# Patient Record
Sex: Female | Born: 1955 | Hispanic: No | Marital: Married | Smoking: Never smoker
Health system: Southern US, Community
[De-identification: ages and names within clinical notes are randomized; demographics above are authoritative.]

## PROBLEM LIST (undated history)

## (undated) DIAGNOSIS — R7303 Prediabetes: Secondary | ICD-10-CM

## (undated) DIAGNOSIS — M1990 Unspecified osteoarthritis, unspecified site: Secondary | ICD-10-CM

## (undated) DIAGNOSIS — I10 Essential (primary) hypertension: Secondary | ICD-10-CM

## (undated) HISTORY — PX: HIP ARTHROPLASTY: SHX981

## (undated) HISTORY — DX: Essential (primary) hypertension: I10

---

## 1999-07-10 ENCOUNTER — Other Ambulatory Visit: Admission: RE | Admit: 1999-07-10 | Discharge: 1999-07-10 | Payer: Self-pay | Admitting: Family Medicine

## 2000-08-28 ENCOUNTER — Other Ambulatory Visit: Admission: RE | Admit: 2000-08-28 | Discharge: 2000-08-28 | Payer: Self-pay | Admitting: Family Medicine

## 2000-09-05 ENCOUNTER — Encounter: Admission: RE | Admit: 2000-09-05 | Discharge: 2000-09-05 | Payer: Self-pay | Admitting: Family Medicine

## 2000-09-05 ENCOUNTER — Encounter: Payer: Self-pay | Admitting: Family Medicine

## 2003-10-22 ENCOUNTER — Other Ambulatory Visit: Admission: RE | Admit: 2003-10-22 | Discharge: 2003-10-22 | Payer: Self-pay | Admitting: Family Medicine

## 2004-10-26 ENCOUNTER — Other Ambulatory Visit: Admission: RE | Admit: 2004-10-26 | Discharge: 2004-10-26 | Payer: Self-pay | Admitting: Family Medicine

## 2005-10-29 ENCOUNTER — Other Ambulatory Visit: Admission: RE | Admit: 2005-10-29 | Discharge: 2005-10-29 | Payer: Self-pay | Admitting: Family Medicine

## 2006-01-20 ENCOUNTER — Observation Stay (HOSPITAL_COMMUNITY): Admission: EM | Admit: 2006-01-20 | Discharge: 2006-01-21 | Payer: Self-pay | Admitting: Emergency Medicine

## 2006-12-05 ENCOUNTER — Other Ambulatory Visit: Admission: RE | Admit: 2006-12-05 | Discharge: 2006-12-05 | Payer: Self-pay | Admitting: Family Medicine
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
## 2008-03-26 ENCOUNTER — Other Ambulatory Visit: Admission: RE | Admit: 2008-03-26 | Discharge: 2008-03-26 | Payer: Self-pay | Admitting: Family Medicine

## 2009-05-23 ENCOUNTER — Other Ambulatory Visit: Admission: RE | Admit: 2009-05-23 | Discharge: 2009-05-23 | Payer: Self-pay | Admitting: Family Medicine

## 2010-05-24 ENCOUNTER — Other Ambulatory Visit
Admission: RE | Admit: 2010-05-24 | Discharge: 2010-05-24 | Payer: Self-pay | Source: Home / Self Care | Admitting: Family Medicine
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
## 2011-05-30 ENCOUNTER — Other Ambulatory Visit (HOSPITAL_COMMUNITY)
Admission: RE | Admit: 2011-05-30 | Discharge: 2011-05-30 | Disposition: A | Discharge status: Home / Self Care | Payer: 59 | Source: Ambulatory Visit | Attending: Family Medicine | Admitting: Family Medicine

## 2011-05-30 ENCOUNTER — Other Ambulatory Visit: Payer: Self-pay | Admitting: Family Medicine

## 2011-05-30 DIAGNOSIS — Z01419 Encounter for gynecological examination (general) (routine) without abnormal findings: Secondary | ICD-10-CM | POA: Insufficient documentation

## 2011-09-05 ENCOUNTER — Telehealth: Payer: Self-pay

## 2011-09-05 NOTE — Telephone Encounter (Signed)
Laurie Cooke and stated she was calling back about her father Laurie Cooke. See phone message under his name

## 2011-09-05 NOTE — Telephone Encounter (Signed)
Pt had called and left message on my VM stating that she was returning our call. I do not see a record of anyone trying to reach pt. We may have been trying to reach her about another pt? LMOM on pt's [phone] that she had left on VM to CB.

## 2014-05-08 ENCOUNTER — Ambulatory Visit (INDEPENDENT_AMBULATORY_CARE_PROVIDER_SITE_OTHER): Payer: 59 | Admitting: Family Medicine

## 2014-05-08 VITALS — BP 132/80 | HR 95 | Temp 98.7°F | Resp 18 | Ht 63.75 in | Wt 194.2 lb

## 2014-05-08 DIAGNOSIS — R05 Cough: Secondary | ICD-10-CM

## 2014-05-08 DIAGNOSIS — J069 Acute upper respiratory infection, unspecified: Secondary | ICD-10-CM

## 2014-05-08 DIAGNOSIS — R059 Cough, unspecified: Secondary | ICD-10-CM

## 2014-05-08 MED ORDER — AMOXICILLIN 875 MG PO TABS: 875.0000 mg | ORAL_TABLET | Freq: Two times a day (BID) | ORAL | Status: DC

## 2014-05-08 MED ORDER — BENZONATATE 100 MG PO CAPS: 100.0000 mg | ORAL_CAPSULE | Freq: Three times a day (TID) | ORAL | Status: DC | PRN

## 2014-05-08 MED ORDER — HYDROCODONE-HOMATROPINE 5-1.5 MG/5ML PO SYRP: 5.0000 mL | ORAL_SOLUTION | ORAL | Status: DC | PRN

## 2014-05-08 NOTE — Patient Instructions (Signed)
Drink plenty of fluids  Get enough rest  Take the antibiotic, amoxicillin, one pill twice daily  Take the benzonatate cough pills one or 2 pills 3 times daily as needed for cough. These are good to use during waking hours.  Take the cough syrup 1 teaspoon every 4 hours as needed for severe cough. This tends to be sedating and is best used at night or when you're not going to be going to work.  Return if worse

## 2014-05-08 NOTE — Progress Notes (Signed)
Subjective: 58 year old lady who's been having a respiratory tract infection for the past 10 days. It started as a sore throat. Then it has settled down into her chest. She has been coughing a lot especially last couple of days. She brings up phlegm. She does not smoke. 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 She is generally healthy. She takes blood pressure medicine one daily, goes to her regular primary care doctor Dr. Wynelle LinkSun on about an annual basis.  Objective: TMs normal. Throat clear. Neck supple without significant nodes. Chest is clear to auscultation without any wheezing. Heart regular without murmurs. She does have a pretty constant cough.  Assessment: Cough URI  Plan: This been going on for 10 days. Will go ahead and treat with antibiotics hence symptomatic cough suppressant.

## 2015-01-17 ENCOUNTER — Other Ambulatory Visit: Payer: Self-pay | Admitting: Family Medicine

## 2015-01-17 ENCOUNTER — Other Ambulatory Visit (HOSPITAL_COMMUNITY)
Admission: RE | Admit: 2015-01-17 | Discharge: 2015-01-17 | Disposition: A | Discharge status: Home / Self Care | Payer: 59 | Source: Ambulatory Visit | Attending: Family Medicine | Admitting: Family Medicine

## 2015-01-17 DIAGNOSIS — Z01419 Encounter for gynecological examination (general) (routine) without abnormal findings: Secondary | ICD-10-CM | POA: Insufficient documentation

## 2015-01-17 DIAGNOSIS — Z1151 Encounter for screening for human papillomavirus (HPV): Secondary | ICD-10-CM | POA: Insufficient documentation

## 2015-01-18 LAB — CYTOLOGY - PAP

## 2016-08-31 DIAGNOSIS — I10 Essential (primary) hypertension: Secondary | ICD-10-CM | POA: Diagnosis not present

## 2016-11-22 DIAGNOSIS — H401121 Primary open-angle glaucoma, left eye, mild stage: Secondary | ICD-10-CM | POA: Diagnosis not present

## 2017-03-20 DIAGNOSIS — R7303 Prediabetes: Secondary | ICD-10-CM | POA: Diagnosis not present

## 2017-03-20 DIAGNOSIS — I10 Essential (primary) hypertension: Secondary | ICD-10-CM | POA: Diagnosis not present

## 2017-04-04 DIAGNOSIS — M25561 Pain in right knee: Secondary | ICD-10-CM | POA: Diagnosis not present

## 2017-04-04 DIAGNOSIS — M25562 Pain in left knee: Secondary | ICD-10-CM | POA: Diagnosis not present

## 2017-04-04 DIAGNOSIS — M1711 Unilateral primary osteoarthritis, right knee: Secondary | ICD-10-CM | POA: Diagnosis not present

## 2017-04-04 DIAGNOSIS — M1712 Unilateral primary osteoarthritis, left knee: Secondary | ICD-10-CM | POA: Diagnosis not present

## 2017-06-10 DIAGNOSIS — H401112 Primary open-angle glaucoma, right eye, moderate stage: Secondary | ICD-10-CM | POA: Diagnosis not present

## 2017-09-25 DIAGNOSIS — I10 Essential (primary) hypertension: Secondary | ICD-10-CM | POA: Diagnosis not present

## 2017-10-05 DIAGNOSIS — Z1231 Encounter for screening mammogram for malignant neoplasm of breast: Secondary | ICD-10-CM | POA: Diagnosis not present

## 2017-12-30 DIAGNOSIS — H401112 Primary open-angle glaucoma, right eye, moderate stage: Secondary | ICD-10-CM | POA: Diagnosis not present

## 2017-12-30 DIAGNOSIS — H25811 Combined forms of age-related cataract, right eye: Secondary | ICD-10-CM | POA: Diagnosis not present

## 2018-04-02 DIAGNOSIS — Z01818 Encounter for other preprocedural examination: Secondary | ICD-10-CM | POA: Diagnosis not present

## 2018-04-02 DIAGNOSIS — H409 Unspecified glaucoma: Secondary | ICD-10-CM | POA: Diagnosis not present

## 2018-04-02 DIAGNOSIS — H401112 Primary open-angle glaucoma, right eye, moderate stage: Secondary | ICD-10-CM | POA: Diagnosis not present

## 2018-04-02 DIAGNOSIS — H401121 Primary open-angle glaucoma, left eye, mild stage: Secondary | ICD-10-CM | POA: Diagnosis not present

## 2018-04-02 DIAGNOSIS — H25811 Combined forms of age-related cataract, right eye: Secondary | ICD-10-CM | POA: Diagnosis not present

## 2018-04-16 DIAGNOSIS — H409 Unspecified glaucoma: Secondary | ICD-10-CM | POA: Diagnosis not present

## 2018-04-16 DIAGNOSIS — H401121 Primary open-angle glaucoma, left eye, mild stage: Secondary | ICD-10-CM | POA: Diagnosis not present

## 2018-04-16 DIAGNOSIS — H2512 Age-related nuclear cataract, left eye: Secondary | ICD-10-CM | POA: Diagnosis not present

## 2018-12-25 ENCOUNTER — Other Ambulatory Visit: Payer: Self-pay | Admitting: Family Medicine

## 2018-12-25 DIAGNOSIS — M25552 Pain in left hip: Secondary | ICD-10-CM

## 2019-01-04 ENCOUNTER — Ambulatory Visit
Admission: RE | Admit: 2019-01-04 | Discharge: 2019-01-04 | Disposition: A | Discharge status: Home / Self Care | Payer: 59 | Source: Ambulatory Visit | Attending: Family Medicine | Admitting: Family Medicine

## 2019-01-04 ENCOUNTER — Other Ambulatory Visit: Payer: Self-pay

## 2019-01-04 DIAGNOSIS — M25552 Pain in left hip: Secondary | ICD-10-CM

## 2019-05-02 ENCOUNTER — Other Ambulatory Visit: Payer: Self-pay

## 2019-05-02 ENCOUNTER — Ambulatory Visit
Admission: EM | Admit: 2019-05-02 | Discharge: 2019-05-02 | Disposition: A | Discharge status: Home / Self Care | Payer: Managed Care, Other (non HMO) | Attending: Emergency Medicine | Admitting: Emergency Medicine

## 2019-05-02 ENCOUNTER — Encounter: Payer: Self-pay | Admitting: Emergency Medicine

## 2019-05-02 DIAGNOSIS — R220 Localized swelling, mass and lump, head: Secondary | ICD-10-CM

## 2019-05-02 MED ORDER — CEPHALEXIN 500 MG PO CAPS: 500.0000 mg | ORAL_CAPSULE | Freq: Two times a day (BID) | ORAL | 0 refills | Status: AC

## 2019-05-02 NOTE — ED Triage Notes (Signed)
Pt presents to [facility] after waking up yesterday with right sided facial swelling.  Denies dental pain.  States it is sensitive to touch and feels hard.

## 2019-05-02 NOTE — Discharge Instructions (Addendum)
Take antibiotic as prescribed. May use ice for swelling. Important to call your PCP, dentist on Monday to schedule follow-up appointments: Preferably 1 to 2 weeks from today.

## 2019-05-02 NOTE — ED Notes (Signed)
Patient able to ambulate independently  

## 2019-05-02 NOTE — ED Provider Notes (Signed)
[facility]    CSN: [id number] Arrival date & time: 05/02/19  0843      History   Chief Complaint Chief Complaint  Patient presents with  . Facial Swelling    HPI Laurie Cooke is a 63 y.o. female with history of hypertension presenting for right-sided facial swelling since yesterday morning.  Patient denies dental pain: Followed by dentist routinely-last visit was in September without significant dental work done at that time.  Patient denies history of boils, lipomas, trauma to the area.  Denies teeth grinding.  Patient has not done anything for her symptoms.   Past Medical History:  Diagnosis Date  . Hypertension     There are no active problems to display for this patient.   History reviewed. No pertinent surgical history.  OB History   No obstetric history on file.      Home Medications    Prior to Admission medications   Medication Sig Start Date End Date Taking? Authorizing Provider  cephALEXin (KEFLEX) 500 MG capsule Take 1 capsule (500 mg total) by mouth 2 (two) times daily for 5 days. 05/02/19 05/07/19  Hall-Potvin, [place], PA-C  valsartan-hydrochlorothiazide (DIOVAN-HCT) 160-25 MG per tablet Take 1 tablet by mouth daily.    [provider]    Family History Family History  Problem Relation Age of Onset  . Hypertension Mother   . Hypertension Father   . Diabetes Father     Social History Social History   Tobacco Use  . Smoking status: Never Smoker  . Smokeless tobacco: Never Used  Substance Use Topics  . Alcohol use: No    Alcohol/week: 0.0 standard drinks  . Drug use: No     Allergies   Patient has no known allergies.   Review of Systems Review of Systems  Constitutional: Negative for activity change, appetite change, fatigue and fever.  HENT: Negative for dental problem, drooling, ear pain, sinus pain, sore throat, trouble swallowing and voice change.   Eyes: Negative for pain, redness and visual disturbance.   Respiratory: Negative for cough and shortness of breath.   Cardiovascular: Negative for chest pain and palpitations.  Gastrointestinal: Negative for abdominal pain, diarrhea and vomiting.  Musculoskeletal: Negative for arthralgias and myalgias.  Skin: Negative for rash and wound.       Right cheek swelling  Neurological: Negative for syncope and headaches.     Physical Exam Triage Vital Signs ED Triage Vitals  Enc Vitals Group     BP 05/02/19 0857 (!) 150/87     Pulse Rate 05/02/19 0857 72     Resp 05/02/19 0857 18     Temp 05/02/19 0857 97.9 F (36.6 C)     Temp Source 05/02/19 0857 Temporal     SpO2 05/02/19 0857 96 %     Weight --      Height --      Head Circumference --      Peak Flow --      Pain Score 05/02/19 0858 0     Pain Loc --      Pain Edu? --      Excl. in [place]? --    No data found.  Updated Vital Signs BP (!) 150/87 (BP Location: Left Arm)   Pulse 72   Temp 97.9 F (36.6 C) (Temporal)   Resp 18   SpO2 96%   Visual Acuity Right Eye Distance:   Left Eye Distance:   Bilateral Distance:    Right Eye Near:  Left Eye Near:    Bilateral Near:     Physical Exam Constitutional:      General: She is not in acute distress. HENT:     Head: Normocephalic and atraumatic.     Mouth/Throat:     Mouth: Mucous membranes are moist.     Pharynx: Oropharynx is clear.     Comments: Fair dentition.  Right upper quadrant without gingival edema, injection, discharge, open wound.  No open wound to inside of affected cheek. Eyes:     General: No scleral icterus.    Pupils: Pupils are equal, round, and reactive to light.  Cardiovascular:     Rate and Rhythm: Normal rate and regular rhythm.  Pulmonary:     Effort: Pulmonary effort is normal. No respiratory distress.     Breath sounds: No wheezing.  Skin:    Capillary Refill: Capillary refill takes less than 2 seconds.     Coloration: Skin is not jaundiced or pale.     Findings: No erythema or rash.      Comments: 2 cm area of firm, nodularity.  No fluctuance, warmth, tenderness to palpation.  Neurological:     Mental Status: She is alert and oriented to person, place, and time.      UC Treatments / Results  Labs (all labs ordered are listed, but only abnormal results are displayed) Labs Reviewed - No data to display  EKG   Radiology No results found.  Procedures Procedures (including critical care time)  Medications Ordered in UC Medications - No data to display  Initial Impression / Assessment and Plan / UC Course  I have reviewed the triage vital signs and the nursing notes.  Pertinent labs & imaging results that were available during my care of the patient were reviewed by me and considered in my medical decision making (see chart for details).     Discussed lipoma versus forming abscess.  Patient denies history of abscess, exam today more consistent with lipoma.  Will treat swelling with ice, possible underlying infection with antibiotic as outlined below.  Discussed appropriate follow-up with dentist, PCP, specifically if no improvement with antibiotics as this would support lipoma/fatty growth.  Return precautions discussed, patient verbalized understanding and is agreeable to plan. Final Clinical Impressions(s) / UC Diagnoses   Final diagnoses:  Right facial swelling     Discharge Instructions     Take antibiotic as prescribed. May use ice for swelling. Important to call your PCP, dentist on Monday to schedule follow-up appointments: Preferably 1 to 2 weeks from today.    ED Prescriptions    Medication Sig Dispense Auth. Provider   cephALEXin (KEFLEX) 500 MG capsule Take 1 capsule (500 mg total) by mouth 2 (two) times daily for 5 days. 10 capsule Hall-Potvin, [place], PA-C     PDMP not reviewed this encounter.   Odette Fraction [place], [place] 05/02/19 [phone]

## 2020-04-29 ENCOUNTER — Ambulatory Visit: Payer: Self-pay

## 2020-04-29 ENCOUNTER — Ambulatory Visit (INDEPENDENT_AMBULATORY_CARE_PROVIDER_SITE_OTHER): Payer: Managed Care, Other (non HMO)

## 2020-04-29 ENCOUNTER — Ambulatory Visit: Payer: Managed Care, Other (non HMO) | Admitting: Orthopaedic Surgery

## 2020-04-29 ENCOUNTER — Other Ambulatory Visit: Payer: Self-pay

## 2020-04-29 ENCOUNTER — Encounter: Payer: Self-pay | Admitting: Orthopaedic Surgery

## 2020-04-29 VITALS — Ht 63.0 in | Wt 197.0 lb

## 2020-04-29 DIAGNOSIS — G8929 Other chronic pain: Secondary | ICD-10-CM | POA: Diagnosis not present

## 2020-04-29 DIAGNOSIS — M1712 Unilateral primary osteoarthritis, left knee: Secondary | ICD-10-CM | POA: Insufficient documentation

## 2020-04-29 DIAGNOSIS — M25561 Pain in right knee: Secondary | ICD-10-CM | POA: Diagnosis not present

## 2020-04-29 DIAGNOSIS — M1711 Unilateral primary osteoarthritis, right knee: Secondary | ICD-10-CM | POA: Insufficient documentation

## 2020-04-29 DIAGNOSIS — M25562 Pain in left knee: Secondary | ICD-10-CM | POA: Diagnosis not present

## 2020-04-29 MED ORDER — MELOXICAM 7.5 MG PO TABS: 7.5000 mg | ORAL_TABLET | Freq: Two times a day (BID) | ORAL | 2 refills | Status: DC | PRN

## 2020-04-29 NOTE — Progress Notes (Signed)
Office Visit Note   Patient: Laurie Cooke           Date of Birth: 09-May-1956           MRN: [id number] Visit Date: 04/29/2020              Requested by: Deatra James, MD [phone] [address],  [place] [address] PCP: Deatra James, MD   Assessment & Plan: Visit Diagnoses:  1. Primary osteoarthritis of right knee   2. Primary osteoarthritis of left knee     Plan: Impression is end-stage bilateral knee DJD worse on the left.  We had a lengthy discussion on treatment options which include both continued nonsurgical and surgical treatment.  She is hesitant to undergo a knee replacement but she understands this is probably most likely way that she will obtain meaningful and long-lasting pain relief and quality of life.  We have given her information on knee replacement surgery.  Questions encouraged and answered.  Meloxicam refilled today.  Follow-up as needed.  Follow-Up Instructions: Return if symptoms worsen or fail to improve.   Orders:  Orders Placed This Encounter  Procedures  . XR KNEE 3 VIEW LEFT  . XR KNEE 3 VIEW RIGHT   Meds ordered this encounter  Medications  . meloxicam (MOBIC) 7.5 MG tablet    Sig: Take 1 tablet (7.5 mg total) by mouth 2 (two) times daily as needed for pain.    Dispense:  60 tablet    Refill:  2      Procedures: No procedures performed   Clinical Data: No additional findings.   Subjective: Chief Complaint  Patient presents with  . Right Knee - Pain  . Left Knee - Pain    [name] is a 64 year old female who comes in for bilateral knee pain for over 9 years with recent worsening worse in the left knee.  She has had prior cortisone injections which only helped the right knee.  The left knee hurts constantly and all over and especially at night.  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  She has a lot of trouble with steps.  She has no quality of life.  She is currently taking Tylenol and meloxicam which  only help take the edge off.  Denies any numbness and tingling.   Review of Systems  Constitutional: Negative.   HENT: Negative.   Eyes: Negative.   Respiratory: Negative.   Cardiovascular: Negative.   Endocrine: Negative.   Musculoskeletal: Negative.   Neurological: Negative.   Hematological: Negative.   Psychiatric/Behavioral: Negative.   All other systems reviewed and are negative.    Objective: Vital Signs: Ht 5\' 3"  (1.6 m)   Wt 197 lb (89.4 kg)   BMI 34.90 kg/m   Physical Exam Vitals and nursing note reviewed.  Constitutional:      Appearance: She is well-developed.  HENT:     Head: Normocephalic and atraumatic.  Pulmonary:     Effort: Pulmonary effort is normal.  Abdominal:     Palpations: Abdomen is soft.  Musculoskeletal:     Cervical back: Neck supple.  Skin:    General: Skin is warm.     Capillary Refill: Capillary refill takes less than 2 seconds.  Neurological:     Mental Status: She is alert and oriented to person, place, and time.  Psychiatric:        Behavior: Behavior normal.  Thought Content: Thought content normal.        Judgment: Judgment normal.     Ortho Exam Bilateral knees show varus deformities.  No joint effusion.  2+ patellofemoral crepitus with range of motion with moderate limitation.  Collaterals and cruciates are stable. Specialty Comments:  No specialty comments available.  Imaging: XR KNEE 3 VIEW LEFT  Result Date: 04/29/2020 Severe tricompartmental DJD with lateral tibial subluxation and varus deformity  XR KNEE 3 VIEW RIGHT  Result Date: 04/29/2020 Severe tricompartmental DJD with varus deformity and early lateral tibial subluxation    PMFS History: Patient Active Problem List   Diagnosis Date Noted  . Primary osteoarthritis of left knee 04/29/2020  . Primary osteoarthritis of right knee 04/29/2020   Past Medical History:  Diagnosis Date  . Hypertension     Family History  Problem Relation Age of  Onset  . Hypertension Mother   . Hypertension Father   . Diabetes Father     History reviewed. No pertinent surgical history. Social History   Occupational History  . Not on file  Tobacco Use  . Smoking status: Never Smoker  . Smokeless tobacco: Never Used  Substance and Sexual Activity  . Alcohol use: No    Alcohol/week: 0.0 standard drinks  . Drug use: No  . Sexual activity: Not on file

## 2020-07-25 ENCOUNTER — Other Ambulatory Visit: Payer: Self-pay | Admitting: Orthopaedic Surgery

## 2021-01-13 ENCOUNTER — Other Ambulatory Visit (HOSPITAL_COMMUNITY)
Admission: RE | Admit: 2021-01-13 | Discharge: 2021-01-13 | Disposition: A | Discharge status: Home / Self Care | Payer: Managed Care, Other (non HMO) | Source: Ambulatory Visit | Attending: Family Medicine | Admitting: Family Medicine

## 2021-01-13 ENCOUNTER — Other Ambulatory Visit: Payer: Self-pay | Admitting: Family Medicine

## 2021-01-13 DIAGNOSIS — Z124 Encounter for screening for malignant neoplasm of cervix: Secondary | ICD-10-CM | POA: Insufficient documentation

## 2021-01-17 LAB — CYTOLOGY - PAP
Comment: NEGATIVE
Diagnosis: NEGATIVE
High risk HPV: NEGATIVE

## 2021-01-22 ENCOUNTER — Other Ambulatory Visit: Payer: Self-pay | Admitting: Physician Assistant

## 2021-11-16 ENCOUNTER — Other Ambulatory Visit (HOSPITAL_COMMUNITY): Payer: Managed Care, Other (non HMO)

## 2021-11-29 ENCOUNTER — Ambulatory Visit: Admit: 2021-11-29 | Payer: Managed Care, Other (non HMO) | Admitting: Orthopedic Surgery

## 2021-11-29 SURGERY — ARTHROPLASTY, HIP, TOTAL, ANTERIOR APPROACH
Anesthesia: Choice | Site: Hip | Laterality: Left

## 2021-12-15 ENCOUNTER — Ambulatory Visit: Payer: Self-pay | Admitting: Student

## 2022-02-05 NOTE — Patient Instructions (Addendum)
SURGICAL WAITING ROOM VISITATION Patients having surgery or a procedure may have no more than 2 support people in the waiting area - these visitors may rotate.   Children under the age of 18 must have an adult with them who is not the patient. If the patient needs to stay at the hospital during part of their recovery, the visitor guidelines for inpatient rooms apply. Pre-op nurse will coordinate an appropriate time for 1 support person to accompany patient in pre-op.  This support person may not rotate.    Please refer to the [facility] website for the visitor guidelines for Inpatients (after your surgery is over and you are in a regular room).      Your procedure is scheduled on: 02-22-22   Report to [facility] Main Entrance    Report to admitting at 5:15 AM   Call this number if you have problems the morning of surgery [phone]   Do not eat food :After Midnight.   After Midnight you may have the following liquids until 4:30 AM DAY OF SURGERY  Water Non-Citrus Juices (without pulp, NO RED) Carbonated Beverages Black Coffee (NO MILK/CREAM OR CREAMERS, sugar ok)  Clear Tea (NO MILK/CREAM OR CREAMERS, sugar ok) regular and decaf                             Plain Jell-O (NO RED)                                           Fruit ices (not with fruit pulp, NO RED)                                     Popsicles (NO RED)                                                               Sports drinks like Gatorade (NO RED)                  The day of surgery:  Drink ONE (1) Pre-Surgery G2 at 4:30 AM the morning of surgery. Drink in one sitting. Do not sip.  This drink was given to you during your hospital  pre-op appointment visit. Nothing else to drink after completing the Pre-Surgery G2          If you have questions, please contact your surgeon's office.   FOLLOW  AND ANY ADDITIONAL PRE OP INSTRUCTIONS YOU RECEIVED FROM YOUR SURGEON'S OFFICE!!!     Oral Hygiene is also  important to reduce your risk of infection.                                    Remember - BRUSH YOUR TEETH THE MORNING OF SURGERY WITH YOUR REGULAR TOOTHPASTE   Do NOT smoke after Midnight   Take these medicines the morning of surgery with A SIP OF WATER: None  You may not have any metal on your body including hair pins, jewelry, and body piercing             Do not wear make-up, lotions, powders, perfumes or deodorant  Do not wear nail polish including gel and S&S, artificial/acrylic nails, or any other type of covering on natural nails including finger and toenails. If you have artificial nails, gel coating, etc. that needs to be removed by a nail salon please have this removed prior to surgery or surgery may need to be canceled/ delayed if the surgeon/ anesthesia feels like they are unable to be safely monitored.   Do not shave  48 hours prior to surgery.    Do not bring valuables to the hospital. [facility] IS NOT RESPONSIBLE   FOR VALUABLES.   Contacts, dentures or bridgework may not be worn into surgery.  DO NOT BRING YOUR HOME MEDICATIONS TO THE HOSPITAL. PHARMACY WILL DISPENSE MEDICATIONS LISTED ON YOUR MEDICATION LIST TO YOU DURING YOUR ADMISSION IN THE HOSPITAL!   Patients discharged on the day of surgery will not be allowed to drive home.  Someone NEEDS to stay with you for the first 24 hours after anesthesia.   Please read over the following fact sheets you were given: IF YOU HAVE QUESTIONS ABOUT YOUR PRE-OP INSTRUCTIONS PLEASE CALL [phone] [facility] - Preparing for Surgery Before surgery, you can play an important role.  Because skin is not sterile, your skin needs to be as free of germs as possible.  You can reduce the number of germs on your skin by washing with CHG (chlorahexidine gluconate) soap before surgery.  CHG is an antiseptic cleaner which kills germs and bonds with the skin to continue killing germs even after  washing. Please DO NOT use if you have an allergy to CHG or antibacterial soaps.  If your skin becomes reddened/irritated stop using the CHG and inform your nurse when you arrive at Short Stay. Do not shave (including legs and underarms) for at least 48 hours prior to the first CHG shower.  You may shave your face/neck.  Please follow these instructions carefully:  1.  Shower with CHG Soap the night before surgery and the  morning of surgery.  2.  If you choose to wash your hair, wash your hair first as usual with your normal  shampoo.  3.  After you shampoo, rinse your hair and body thoroughly to remove the shampoo.                             4.  Use CHG as you would any other liquid soap.  You can apply chg directly to the skin and wash.  Gently with a scrungie or clean washcloth.  5.  Apply the CHG Soap to your body ONLY FROM THE NECK DOWN.   Do   not use on face/ open                           Wound or open sores. Avoid contact with eyes, ears mouth and   genitals (private parts).                       Wash face,  Genitals (private parts) with your normal soap.             6.  Wash thoroughly, paying special attention to the  area where your    surgery  will be performed.  7.  Thoroughly rinse your body with warm water from the neck down.  8.  DO NOT shower/wash with your normal soap after using and rinsing off the CHG Soap.                9.  Pat yourself dry with a clean towel.            10.  Wear clean pajamas.            11.  Place clean sheets on your bed the night of your first shower and do not  sleep with pets. Day of Surgery : Do not apply any lotions/deodorants the morning of surgery.  Please wear clean clothes to the hospital/surgery center.  FAILURE TO FOLLOW THESE INSTRUCTIONS MAY RESULT IN THE CANCELLATION OF YOUR SURGERY  PATIENT SIGNATURE_________________________________  NURSE  SIGNATURE__________________________________  ________________________________________________________________________     Laurie Cooke  An incentive spirometer is a tool that can help keep your lungs clear and active. This tool measures how well you are filling your lungs with each breath. Taking long deep breaths may help reverse or decrease the chance of developing breathing (pulmonary) problems (especially infection) following: A long period of time when you are unable to move or be active. BEFORE THE PROCEDURE  If the spirometer includes an indicator to show your best effort, your nurse or respiratory therapist will set it to a desired goal. If possible, sit up straight or lean slightly forward. Try not to slouch. Hold the incentive spirometer in an upright position. INSTRUCTIONS FOR USE  Sit on the edge of your bed if possible, or sit up as far as you can in bed or on a chair. Hold the incentive spirometer in an upright position. Breathe out normally. Place the mouthpiece in your mouth and seal your lips tightly around it. Breathe in slowly and as deeply as possible, raising the piston or the ball toward the top of the column. Hold your breath for 3-5 seconds or for as long as possible. Allow the piston or ball to fall to the bottom of the column. Remove the mouthpiece from your mouth and breathe out normally. Rest for a few seconds and repeat Steps 1 through 7 at least 10 times every 1-2 hours when you are awake. Take your time and take a few normal breaths between deep breaths. The spirometer may include an indicator to show your best effort. Use the indicator as a goal to work toward during each repetition. After each set of 10 deep breaths, practice coughing to be sure your lungs are clear. If you have an incision (the cut made at the time of surgery), support your incision when coughing by placing a pillow or rolled up towels firmly against it. Once you are able to get out  of bed, walk around indoors and cough well. You may stop using the incentive spirometer when instructed by your caregiver.  RISKS AND COMPLICATIONS Take your time so you do not get dizzy or light-headed. If you are in pain, you may need to take or ask for pain medication before doing incentive spirometry. It is harder to take a deep breath if you are having pain. AFTER USE Rest and breathe slowly and easily. It can be helpful to keep track of a log of your progress. Your caregiver can provide you with a simple table to help with this. If you are using the spirometer at  home, follow these instructions: [place] IF:  You are having difficultly using the spirometer. You have trouble using the spirometer as often as instructed. Your pain medication is not giving enough relief while using the spirometer. You develop fever of 100.5 F (38.1 C) or higher. SEEK IMMEDIATE MEDICAL CARE IF:  You cough up bloody sputum that had not been present before. You develop fever of 102 F (38.9 C) or greater. You develop worsening pain at or near the incision site. MAKE SURE YOU:  Understand these instructions. Will watch your condition. Will get help right away if you are not doing well or get worse. Document Released: 10/08/2006 Document Revised: 08/20/2011 Document Reviewed: 12/09/2006 [facility] Patient Information 2014 [place], [place].   ________________________________________________________________________

## 2022-02-06 ENCOUNTER — Ambulatory Visit (HOSPITAL_COMMUNITY)
Admission: RE | Admit: 2022-02-06 | Discharge: 2022-02-06 | Disposition: A | Discharge status: Home / Self Care | Payer: Managed Care, Other (non HMO) | Source: Ambulatory Visit | Attending: Cardiovascular Disease | Admitting: Cardiovascular Disease

## 2022-02-06 ENCOUNTER — Other Ambulatory Visit (HOSPITAL_COMMUNITY): Payer: Self-pay | Admitting: Orthopedic Surgery

## 2022-02-06 DIAGNOSIS — M79605 Pain in left leg: Secondary | ICD-10-CM | POA: Diagnosis present

## 2022-02-09 ENCOUNTER — Encounter (HOSPITAL_COMMUNITY): Payer: Self-pay

## 2022-02-09 ENCOUNTER — Encounter (HOSPITAL_COMMUNITY)
Admission: RE | Admit: 2022-02-09 | Discharge: 2022-02-09 | Disposition: A | Discharge status: Home / Self Care | Payer: Managed Care, Other (non HMO) | Source: Ambulatory Visit | Attending: Orthopedic Surgery | Admitting: Orthopedic Surgery

## 2022-02-09 ENCOUNTER — Other Ambulatory Visit: Payer: Self-pay

## 2022-02-09 VITALS — BP 165/88 | HR 64 | Temp 98.9°F | Resp 18 | Ht 62.0 in | Wt 180.6 lb

## 2022-02-09 DIAGNOSIS — Z01818 Encounter for other preprocedural examination: Secondary | ICD-10-CM

## 2022-02-09 DIAGNOSIS — I2510 Atherosclerotic heart disease of native coronary artery without angina pectoris: Secondary | ICD-10-CM | POA: Insufficient documentation

## 2022-02-09 DIAGNOSIS — Z01812 Encounter for preprocedural laboratory examination: Secondary | ICD-10-CM | POA: Diagnosis present

## 2022-02-09 DIAGNOSIS — R7303 Prediabetes: Secondary | ICD-10-CM | POA: Insufficient documentation

## 2022-02-09 HISTORY — DX: Unspecified osteoarthritis, unspecified site: M19.90

## 2022-02-09 HISTORY — DX: Prediabetes: R73.03

## 2022-02-09 LAB — CBC
HCT: 32.9 % — ABNORMAL LOW (ref 36.0–46.0)
Hemoglobin: 10 g/dL — ABNORMAL LOW (ref 12.0–15.0)
MCH: 25.2 pg — ABNORMAL LOW (ref 26.0–34.0)
MCHC: 30.4 g/dL (ref 30.0–36.0)
MCV: 82.9 fL (ref 80.0–100.0)
Platelets: 354 10*3/uL (ref 150–400)
RBC: 3.97 MIL/uL (ref 3.87–5.11)
RDW: 14.5 % (ref 11.5–15.5)
WBC: 5.2 10*3/uL (ref 4.0–10.5)
nRBC: 0 % (ref 0.0–0.2)

## 2022-02-09 LAB — BASIC METABOLIC PANEL
Anion gap: 6 (ref 5–15)
BUN: 21 mg/dL (ref 8–23)
CO2: 28 mmol/L (ref 22–32)
Calcium: 10 mg/dL (ref 8.9–10.3)
Chloride: 106 mmol/L (ref 98–111)
Creatinine, Ser: 0.86 mg/dL (ref 0.44–1.00)
GFR, Estimated: >60 mL/min (ref >60)
Glucose, Bld: 121 mg/dL — ABNORMAL HIGH (ref 70–99)
Potassium: 3.9 mmol/L (ref 3.5–5.1)
Sodium: 140 mmol/L (ref 135–145)

## 2022-02-09 LAB — SURGICAL PCR SCREEN
MRSA, PCR: NEGATIVE
Staphylococcus aureus: NEGATIVE

## 2022-02-09 LAB — HEMOGLOBIN A1C
Hgb A1c MFr Bld: 6 % — ABNORMAL HIGH (ref 4.8–5.6)
Mean Plasma Glucose: 125.5 mg/dL

## 2022-02-09 LAB — GLUCOSE, CAPILLARY: Glucose-Capillary: 121 mg/dL — ABNORMAL HIGH (ref 70–99)

## 2022-02-09 NOTE — Progress Notes (Addendum)
COVID Vaccine Completed:  Yes  Date of COVID positive in last 90 days:  No  PCP - Deatra James, MD (office note on chart) Cardiologist - N/A  Chest x-ray -  N/A EKG - 08-23-21 at PCP.  Requested Stress Test -  N/A ECHO -  N/A Cardiac Cath -  N/A Pacemaker/ICD device last checked: Spinal Cord Stimulator:  N/A  Bowel Prep -  N/A  Sleep Study -  N/A CPAP -   Prediabetes Fasting Blood Sugar -  Checks Blood Sugar - does not check   Blood Thinner Instructions:  N/A Aspirin Instructions: Last Dose:  Activity level:   Can go up a flight of stairs and perform activities of daily living without stopping and without symptoms of chest pain or shortness of breath.  Anesthesia review:  BP elevated at PAT 182/87 and on recheck 165/88.  Patient denied headache, chest pain or SOB.  Came to appt after working third shift and had not taken BP meds.  Patient is able to monitor at home, scheduled to see PCP next week.  Patient denies shortness of breath, fever, cough and chest pain at PAT appointment  Patient verbalized understanding of instructions that were given to them at the PAT appointment. Patient was also instructed that they will need to review over the PAT instructions again at home before surgery.

## 2022-02-17 ENCOUNTER — Ambulatory Visit: Payer: Self-pay | Admitting: Student

## 2022-02-20 ENCOUNTER — Ambulatory Visit: Payer: Self-pay | Admitting: Student

## 2022-02-20 NOTE — H&P (Signed)
TOTAL KNEE ADMISSION H&P  Patient is being admitted for left total knee arthroplasty.  Subjective:  Chief Complaint:left knee pain.  HPI: Laurie Cooke, 66 y.o. female, has a history of pain and functional disability in the left knee due to arthritis and has failed non-surgical conservative treatments for greater than 12 weeks to includeNSAID's and/or analgesics, corticosteriod injections, flexibility and strengthening excercises, supervised PT with diminished ADL's post treatment, use of assistive devices, and activity modification.  Onset of symptoms was gradual, starting 8 years ago with rapidlly worsening course since that time. The patient noted no past surgery on the left knee(s).  Patient currently rates pain in the left knee(s) at 10 out of 10 with activity. Patient has night pain, worsening of pain with activity and weight bearing, pain that interferes with activities of daily living, pain with passive range of motion, and joint swelling.  Patient has evidence of subchondral cysts, subchondral sclerosis, periarticular osteophytes, and joint space narrowing by imaging studies. There is no active infection.  Patient Active Problem List   Diagnosis Date Noted   Primary osteoarthritis of left knee 04/29/2020   Primary osteoarthritis of right knee 04/29/2020   Past Medical History:  Diagnosis Date   Arthritis    Hypertension    Pre-diabetes     Past Surgical History:  Procedure Laterality Date   HIP ARTHROPLASTY Left     Current Outpatient Medications  Medication Sig Dispense Refill Last Dose   acetaminophen (TYLENOL) 500 MG tablet Take 1,000 mg by mouth every 6 (six) hours as needed for mild pain.      latanoprost (XALATAN) 0.005 % ophthalmic solution Place 1 drop into both eyes at bedtime.      meloxicam (MOBIC) 15 MG tablet Take 15 mg by mouth daily.      timolol (TIMOPTIC) 0.5 % ophthalmic solution Place 1 drop into both eyes 2 (two) times daily.       valsartan-hydrochlorothiazide (DIOVAN-HCT) 160-25 MG per tablet Take 1 tablet by mouth daily.      No current facility-administered medications for this visit.   No Known Allergies  Social History   Tobacco Use   Smoking status: Never   Smokeless tobacco: Never  Substance Use Topics   Alcohol use: No    Alcohol/week: 0.0 standard drinks of alcohol    Family History  Problem Relation Age of Onset   Hypertension Mother    Hypertension Father    Diabetes Father      Review of Systems  Musculoskeletal:  Positive for arthralgias, gait problem and joint swelling.  All other systems reviewed and are negative.   Objective:  Physical Exam Constitutional:      Appearance: Normal appearance.  HENT:     Head: Normocephalic and atraumatic.     Mouth/Throat:     Mouth: Mucous membranes are moist.     Pharynx: Oropharynx is clear.  Eyes:     Extraocular Movements: Extraocular movements intact.  Cardiovascular:     Rate and Rhythm: Normal rate and regular rhythm.     Pulses: Normal pulses.     Heart sounds: Normal heart sounds.  Pulmonary:     Effort: Pulmonary effort is normal.     Breath sounds: Normal breath sounds.  Abdominal:     General: Abdomen is flat.     Palpations: Abdomen is soft.  Genitourinary:    Comments: deferred Musculoskeletal:     Cervical back: Normal range of motion and neck supple.     Comments:  Examination of the left knee reveals no skin wounds or lesions. She has some knee swelling. Trace effusion. No warmth or erythema. Tenderness palpation medial joint line, lateral joint line, peripatellar retinacular tissues with a positive grind sign. Range of motion is 14 to 110 degrees without any ligamentous instability. No extensor lag.   Sensory and motor function intact in LE bilaterally. Distal pedal pulses 2+ bilaterally.   Skin:    General: Skin is warm and dry.     Capillary Refill: Capillary refill takes less than 2 seconds.  Neurological:      General: No focal deficit present.     Mental Status: She is alert and oriented to person, place, and time.  Psychiatric:        Mood and Affect: Mood normal.        Behavior: Behavior normal.        Thought Content: Thought content normal.        Judgment: Judgment normal.     Vital signs in last 24 hours: @VSRANGES@  Labs:   Estimated body mass index is 33.03 kg/m as calculated from the following:   Height as of 02/09/22: 5' 2" (1.575 m).   Weight as of 02/09/22: 81.9 kg.   Imaging Review Plain radiographs demonstrate severe degenerative joint disease of the left knee(s). The overall alignment ismild varus. The bone quality appears to be adequate for age and reported activity level.      Assessment/Plan:  End stage arthritis, left knee   The patient history, physical examination, clinical judgment of the provider and imaging studies are consistent with end stage degenerative joint disease of the left knee(s) and total knee arthroplasty is deemed medically necessary. The treatment options including medical management, injection therapy arthroscopy and arthroplasty were discussed at length. The risks and benefits of total knee arthroplasty were presented and reviewed. The risks due to aseptic loosening, infection, stiffness, patella tracking problems, thromboembolic complications and other imponderables were discussed. The patient acknowledged the explanation, agreed to proceed with the plan and consent was signed. Patient is being admitted for inpatient treatment for surgery, pain control, PT, OT, prophylactic antibiotics, VTE prophylaxis, progressive ambulation and ADL's and discharge planning. The patient is planning to be discharged home with OPPT.  Therapy Plans: outpatient therapy EO  Disposition: Home with husband Planned DVT Prophylaxis: aspirin 81mg BID DME needed: None. Has rolling walker.  PCP: Cleared.  TXA: IV Allergies: NKDA Anesthesia Concerns: None.  BMI:  34.2 Last HgbA1c: 6.2 Other: - Prediabetes - Left THA 10/13/21.  - Oxycodone, methocarbamol, zofran, meloxicam has.  -Hbg 10.0, Cr. 0.86. 02/09/22.     Patient's anticipated LOS is less than 2 midnights, meeting these requirements: - Younger than 65 - Lives within 1 hour of care - Has a competent adult at home to recover with post-op recover - NO history of  - Chronic pain requiring opiods  - Diabetes  - Coronary Artery Disease  - Heart failure  - Heart attack  - Stroke  - DVT/VTE  - Cardiac arrhythmia  - Respiratory Failure/COPD  - Renal failure  - Anemia  - Advanced Liver disease   

## 2022-02-20 NOTE — H&P (View-Only) (Signed)
TOTAL KNEE ADMISSION H&P  Patient is being admitted for left total knee arthroplasty.  Subjective:  Chief Complaint:left knee pain.  HPI: Laurie Cooke, 66 y.o. female, has a history of pain and functional disability in the left knee due to arthritis and has failed non-surgical conservative treatments for greater than 12 weeks to includeNSAID's and/or analgesics, corticosteriod injections, flexibility and strengthening excercises, supervised PT with diminished ADL's post treatment, use of assistive devices, and activity modification.  Onset of symptoms was gradual, starting 8 years ago with rapidlly worsening course since that time. The patient noted no past surgery on the left knee(s).  Patient currently rates pain in the left knee(s) at 10 out of 10 with activity. Patient has night pain, worsening of pain with activity and weight bearing, pain that interferes with activities of daily living, pain with passive range of motion, and joint swelling.  Patient has evidence of subchondral cysts, subchondral sclerosis, periarticular osteophytes, and joint space narrowing by imaging studies. There is no active infection.  Patient Active Problem List   Diagnosis Date Noted   Primary osteoarthritis of left knee 04/29/2020   Primary osteoarthritis of right knee 04/29/2020   Past Medical History:  Diagnosis Date   Arthritis    Hypertension    Pre-diabetes     Past Surgical History:  Procedure Laterality Date   HIP ARTHROPLASTY Left     Current Outpatient Medications  Medication Sig Dispense Refill Last Dose   acetaminophen (TYLENOL) 500 MG tablet Take 1,000 mg by mouth every 6 (six) hours as needed for mild pain.      latanoprost (XALATAN) 0.005 % ophthalmic solution Place 1 drop into both eyes at bedtime.      meloxicam (MOBIC) 15 MG tablet Take 15 mg by mouth daily.      timolol (TIMOPTIC) 0.5 % ophthalmic solution Place 1 drop into both eyes 2 (two) times daily.       valsartan-hydrochlorothiazide (DIOVAN-HCT) 160-25 MG per tablet Take 1 tablet by mouth daily.      No current facility-administered medications for this visit.   No Known Allergies  Social History   Tobacco Use   Smoking status: Never   Smokeless tobacco: Never  Substance Use Topics   Alcohol use: No    Alcohol/week: 0.0 standard drinks of alcohol    Family History  Problem Relation Age of Onset   Hypertension Mother    Hypertension Father    Diabetes Father      Review of Systems  Musculoskeletal:  Positive for arthralgias, gait problem and joint swelling.  All other systems reviewed and are negative.   Objective:  Physical Exam Constitutional:      Appearance: Normal appearance.  HENT:     Head: Normocephalic and atraumatic.     Mouth/Throat:     Mouth: Mucous membranes are moist.     Pharynx: Oropharynx is clear.  Eyes:     Extraocular Movements: Extraocular movements intact.  Cardiovascular:     Rate and Rhythm: Normal rate and regular rhythm.     Pulses: Normal pulses.     Heart sounds: Normal heart sounds.  Pulmonary:     Effort: Pulmonary effort is normal.     Breath sounds: Normal breath sounds.  Abdominal:     General: Abdomen is flat.     Palpations: Abdomen is soft.  Genitourinary:    Comments: deferred Musculoskeletal:     Cervical back: Normal range of motion and neck supple.     Comments:  Examination of the left knee reveals no skin wounds or lesions. She has some knee swelling. Trace effusion. No warmth or erythema. Tenderness palpation medial joint line, lateral joint line, peripatellar retinacular tissues with a positive grind sign. Range of motion is 14 to 110 degrees without any ligamentous instability. No extensor lag.   Sensory and motor function intact in LE bilaterally. Distal pedal pulses 2+ bilaterally.   Skin:    General: Skin is warm and dry.     Capillary Refill: Capillary refill takes less than 2 seconds.  Neurological:      General: No focal deficit present.     Mental Status: She is alert and oriented to person, place, and time.  Psychiatric:        Mood and Affect: Mood normal.        Behavior: Behavior normal.        Thought Content: Thought content normal.        Judgment: Judgment normal.     Vital signs in last 24 hours: @VSRANGES @  Labs:   Estimated body mass index is 33.03 kg/m as calculated from the following:   Height as of 02/09/22: 5\' 2"  (1.575 m).   Weight as of 02/09/22: 81.9 kg.   Imaging Review Plain radiographs demonstrate severe degenerative joint disease of the left knee(s). The overall alignment ismild varus. The bone quality appears to be adequate for age and reported activity level.      Assessment/Plan:  End stage arthritis, left knee   The patient history, physical examination, clinical judgment of the provider and imaging studies are consistent with end stage degenerative joint disease of the left knee(s) and total knee arthroplasty is deemed medically necessary. The treatment options including medical management, injection therapy arthroscopy and arthroplasty were discussed at length. The risks and benefits of total knee arthroplasty were presented and reviewed. The risks due to aseptic loosening, infection, stiffness, patella tracking problems, thromboembolic complications and other imponderables were discussed. The patient acknowledged the explanation, agreed to proceed with the plan and consent was signed. Patient is being admitted for inpatient treatment for surgery, pain control, PT, OT, prophylactic antibiotics, VTE prophylaxis, progressive ambulation and ADL's and discharge planning. The patient is planning to be discharged home with OPPT.  Therapy Plans: outpatient therapy EO  Disposition: Home with husband Planned DVT Prophylaxis: aspirin 81mg  BID DME needed: None. Has rolling walker.  PCP: Cleared.  TXA: IV Allergies: NKDA Anesthesia Concerns: None.  BMI:  34.2 Last HgbA1c: 6.2 Other: - Prediabetes - Left THA 10/13/21.  - Oxycodone, methocarbamol, zofran, meloxicam has.  -Hbg 10.0, Cr. 0.86. 02/09/22.     Patient's anticipated LOS is less than 2 midnights, meeting these requirements: - Younger than 40 - Lives within 1 hour of care - Has a competent adult at home to recover with post-op recover - NO history of  - Chronic pain requiring opiods  - Diabetes  - Coronary Artery Disease  - Heart failure  - Heart attack  - Stroke  - DVT/VTE  - Cardiac arrhythmia  - Respiratory Failure/COPD  - Renal failure  - Anemia  - Advanced Liver disease

## 2022-02-21 NOTE — Anesthesia Preprocedure Evaluation (Addendum)
Anesthesia Evaluation  Patient identified by MRN, date of birth, ID band Patient awake    Reviewed: Allergy & Precautions, NPO status , Patient's Chart, lab work & pertinent test results  Airway Mallampati: IV  TM Distance: >3 FB Neck ROM: Full   Comment: Fairly uncooperative w/ airway exam, minimal effort Dental  (+) Teeth Intact, Dental Advisory Given   Pulmonary neg pulmonary ROS,    Pulmonary exam normal breath sounds clear to auscultation       Cardiovascular hypertension (155/81 in preop, no idea what her BP normally is ), Pt. on medications Normal cardiovascular exam Rhythm:Regular Rate:Normal     Neuro/Psych negative neurological ROS  negative psych ROS   GI/Hepatic negative GI ROS, Neg liver ROS,   Endo/Other  diabetes (pre-diabetic )Obesity BMI 33   Renal/GU negative Renal ROS  negative genitourinary   Musculoskeletal  (+) Arthritis , Osteoarthritis,    Abdominal   Peds  Hematology  (+) Blood dyscrasia, anemia , Hb 10   Anesthesia Other Findings   Reproductive/Obstetrics negative OB ROS                           Anesthesia Physical Anesthesia Plan  ASA: 2  Anesthesia Plan: Spinal, Regional and MAC   Post-op Pain Management: Regional block* and Tylenol PO (pre-op)*   Induction:   PONV Risk Score and Plan: 2 and Propofol infusion and TIVA  Airway Management Planned: Natural Airway and Nasal Cannula  Additional Equipment: None  Intra-op Plan:   Post-operative Plan:   Informed Consent: I have reviewed the patients History and Physical, chart, labs and discussed the procedure including the risks, benefits and alternatives for the proposed anesthesia with the patient or authorized representative who has indicated his/her understanding and acceptance.     Dental advisory given  Plan Discussed with: CRNA  Anesthesia Plan Comments:        Anesthesia Quick  Evaluation

## 2022-02-22 ENCOUNTER — Ambulatory Visit (HOSPITAL_COMMUNITY)
Admission: RE | Admit: 2022-02-22 | Discharge: 2022-02-22 | Disposition: A | Discharge status: Home / Self Care | Payer: Managed Care, Other (non HMO) | Source: Ambulatory Visit | Attending: Orthopedic Surgery | Admitting: Orthopedic Surgery

## 2022-02-22 ENCOUNTER — Ambulatory Visit (HOSPITAL_COMMUNITY): Payer: Managed Care, Other (non HMO) | Admitting: Physician Assistant

## 2022-02-22 ENCOUNTER — Ambulatory Visit (HOSPITAL_COMMUNITY): Payer: Managed Care, Other (non HMO) | Admitting: Anesthesiology

## 2022-02-22 ENCOUNTER — Other Ambulatory Visit: Payer: Self-pay

## 2022-02-22 ENCOUNTER — Encounter (HOSPITAL_COMMUNITY): Payer: Self-pay | Admitting: Orthopedic Surgery

## 2022-02-22 ENCOUNTER — Encounter (HOSPITAL_COMMUNITY)
Admission: RE | Disposition: A | Discharge status: Home / Self Care | Payer: Self-pay | Source: Ambulatory Visit | Attending: Orthopedic Surgery

## 2022-02-22 ENCOUNTER — Ambulatory Visit (HOSPITAL_COMMUNITY): Payer: Managed Care, Other (non HMO)

## 2022-02-22 DIAGNOSIS — Z96652 Presence of left artificial knee joint: Secondary | ICD-10-CM

## 2022-02-22 DIAGNOSIS — M1712 Unilateral primary osteoarthritis, left knee: Secondary | ICD-10-CM

## 2022-02-22 DIAGNOSIS — I10 Essential (primary) hypertension: Secondary | ICD-10-CM | POA: Diagnosis not present

## 2022-02-22 DIAGNOSIS — M1990 Unspecified osteoarthritis, unspecified site: Secondary | ICD-10-CM

## 2022-02-22 DIAGNOSIS — D649 Anemia, unspecified: Secondary | ICD-10-CM | POA: Diagnosis not present

## 2022-02-22 HISTORY — PX: KNEE ARTHROPLASTY: SHX992

## 2022-02-22 SURGERY — ARTHROPLASTY, KNEE, TOTAL, USING IMAGELESS COMPUTER-ASSISTED NAVIGATION
Anesthesia: Monitor Anesthesia Care | Site: Knee | Laterality: Left

## 2022-02-22 MED ORDER — OXYCODONE HCL 5 MG PO TABS
ORAL_TABLET | ORAL | Status: AC
  Filled 2022-02-22: qty 2

## 2022-02-22 MED ORDER — KETOROLAC TROMETHAMINE 30 MG/ML IJ SOLN
INTRAMUSCULAR | Status: AC
  Filled 2022-02-22: qty 1

## 2022-02-22 MED ORDER — CHLORHEXIDINE GLUCONATE 0.12 % MT SOLN
15.0000 mL | Freq: Once | OROMUCOSAL | Status: AC
  Administered 2022-02-22: 15 mL via OROMUCOSAL

## 2022-02-22 MED ORDER — PHENYLEPHRINE HCL-NACL 20-0.9 MG/250ML-% IV SOLN
INTRAVENOUS | Status: DC | PRN
  Administered 2022-02-22: 30 ug/min via INTRAVENOUS

## 2022-02-22 MED ORDER — PROPOFOL 500 MG/50ML IV EMUL
INTRAVENOUS | Status: DC | PRN
  Administered 2022-02-22: 50 ug/kg/min via INTRAVENOUS

## 2022-02-22 MED ORDER — BUPIVACAINE-EPINEPHRINE (PF) 0.25% -1:200000 IJ SOLN
INTRAMUSCULAR | Status: AC
  Filled 2022-02-22: qty 30

## 2022-02-22 MED ORDER — ISOPROPYL ALCOHOL 70 % SOLN
Status: DC | PRN
  Administered 2022-02-22: 1 via TOPICAL

## 2022-02-22 MED ORDER — ACETAMINOPHEN 500 MG PO TABS: 1000.0000 mg | ORAL_TABLET | Freq: Once | ORAL | Status: DC

## 2022-02-22 MED ORDER — OXYCODONE HCL 5 MG PO TABS: 5.0000 mg | ORAL_TABLET | ORAL | Status: DC | PRN

## 2022-02-22 MED ORDER — ORAL CARE MOUTH RINSE: 15.0000 mL | Freq: Once | OROMUCOSAL | Status: AC

## 2022-02-22 MED ORDER — DEXAMETHASONE SODIUM PHOSPHATE 10 MG/ML IJ SOLN
INTRAMUSCULAR | Status: DC | PRN
  Administered 2022-02-22: 10 mg

## 2022-02-22 MED ORDER — OXYCODONE HCL 5 MG PO TABS
10.0000 mg | ORAL_TABLET | ORAL | Status: DC | PRN
  Administered 2022-02-22: 10 mg via ORAL

## 2022-02-22 MED ORDER — SODIUM CHLORIDE 0.9 % IR SOLN
Status: DC | PRN
  Administered 2022-02-22 (×2): 1000 mL

## 2022-02-22 MED ORDER — ONDANSETRON HCL 4 MG/2ML IJ SOLN: 4.0000 mg | Freq: Once | INTRAMUSCULAR | Status: DC | PRN

## 2022-02-22 MED ORDER — OXYCODONE HCL 5 MG/5ML PO SOLN: 5.0000 mg | Freq: Once | ORAL | Status: DC | PRN

## 2022-02-22 MED ORDER — MIDAZOLAM HCL 5 MG/5ML IJ SOLN
INTRAMUSCULAR | Status: DC | PRN
  Administered 2022-02-22: 2 mg via INTRAVENOUS

## 2022-02-22 MED ORDER — ONDANSETRON HCL 4 MG/2ML IJ SOLN
INTRAMUSCULAR | Status: DC | PRN
  Administered 2022-02-22: 4 mg via INTRAVENOUS

## 2022-02-22 MED ORDER — BUPIVACAINE-EPINEPHRINE 0.25% -1:200000 IJ SOLN
INTRAMUSCULAR | Status: DC | PRN
  Administered 2022-02-22: 30 mL

## 2022-02-22 MED ORDER — FENTANYL CITRATE (PF) 100 MCG/2ML IJ SOLN
INTRAMUSCULAR | Status: AC
  Filled 2022-02-22: qty 2

## 2022-02-22 MED ORDER — POVIDONE-IODINE 10 % EX SWAB
2.0000 | Freq: Once | CUTANEOUS | Status: AC
  Administered 2022-02-22: 2 via TOPICAL

## 2022-02-22 MED ORDER — ASPIRIN 81 MG PO CHEW: 81.0000 mg | CHEWABLE_TABLET | Freq: Two times a day (BID) | ORAL | 0 refills | Status: AC

## 2022-02-22 MED ORDER — AMISULPRIDE (ANTIEMETIC) 5 MG/2ML IV SOLN: 10.0000 mg | Freq: Once | INTRAVENOUS | Status: DC | PRN

## 2022-02-22 MED ORDER — METHOCARBAMOL 500 MG IVPB - SIMPLE MED: 500.0000 mg | Freq: Four times a day (QID) | INTRAVENOUS | Status: DC | PRN

## 2022-02-22 MED ORDER — STERILE WATER FOR IRRIGATION IR SOLN
Status: DC | PRN
  Administered 2022-02-22: 2000 mL

## 2022-02-22 MED ORDER — TRANEXAMIC ACID-NACL 1000-0.7 MG/100ML-% IV SOLN
1000.0000 mg | INTRAVENOUS | Status: AC
  Administered 2022-02-22: 1000 mg via INTRAVENOUS
  Filled 2022-02-22: qty 100

## 2022-02-22 MED ORDER — 0.9 % SODIUM CHLORIDE (POUR BTL) OPTIME
TOPICAL | Status: DC | PRN
  Administered 2022-02-22: 1000 mL

## 2022-02-22 MED ORDER — ROPIVACAINE HCL 5 MG/ML IJ SOLN
INTRAMUSCULAR | Status: DC | PRN
  Administered 2022-02-22: 30 mL via EPIDURAL

## 2022-02-22 MED ORDER — CEFAZOLIN SODIUM-DEXTROSE 2-4 GM/100ML-% IV SOLN
2.0000 g | INTRAVENOUS | Status: AC
  Administered 2022-02-22: 2 g via INTRAVENOUS
  Filled 2022-02-22: qty 100

## 2022-02-22 MED ORDER — ONDANSETRON HCL 4 MG PO TABS: 4.0000 mg | ORAL_TABLET | Freq: Three times a day (TID) | ORAL | 0 refills | Status: AC | PRN

## 2022-02-22 MED ORDER — SODIUM CHLORIDE (PF) 0.9 % IJ SOLN
INTRAMUSCULAR | Status: DC | PRN
  Administered 2022-02-22: 30 mL

## 2022-02-22 MED ORDER — ONDANSETRON HCL 4 MG/2ML IJ SOLN
INTRAMUSCULAR | Status: AC
  Filled 2022-02-22: qty 2

## 2022-02-22 MED ORDER — PROPOFOL 10 MG/ML IV BOLUS
INTRAVENOUS | Status: DC | PRN
  Administered 2022-02-22: 20 mg via INTRAVENOUS

## 2022-02-22 MED ORDER — LACTATED RINGERS IV SOLN: INTRAVENOUS | Status: DC

## 2022-02-22 MED ORDER — METHOCARBAMOL 500 MG PO TABS: 500.0000 mg | ORAL_TABLET | Freq: Four times a day (QID) | ORAL | Status: DC | PRN

## 2022-02-22 MED ORDER — ACETAMINOPHEN 500 MG PO TABS
1000.0000 mg | ORAL_TABLET | Freq: Once | ORAL | Status: AC
  Administered 2022-02-22: 1000 mg via ORAL
  Filled 2022-02-22: qty 2

## 2022-02-22 MED ORDER — HYDROMORPHONE HCL 1 MG/ML IJ SOLN: 0.2500 mg | INTRAMUSCULAR | Status: DC | PRN

## 2022-02-22 MED ORDER — FENTANYL CITRATE (PF) 100 MCG/2ML IJ SOLN
INTRAMUSCULAR | Status: DC | PRN
  Administered 2022-02-22 (×2): 50 ug via INTRAVENOUS

## 2022-02-22 MED ORDER — BUPIVACAINE IN DEXTROSE 0.75-8.25 % IT SOLN
INTRATHECAL | Status: DC | PRN
  Administered 2022-02-22: 2 mL via INTRATHECAL

## 2022-02-22 MED ORDER — OXYCODONE HCL 5 MG PO TABS: 5.0000 mg | ORAL_TABLET | Freq: Once | ORAL | Status: DC | PRN

## 2022-02-22 MED ORDER — LACTATED RINGERS IV BOLUS
500.0000 mL | Freq: Once | INTRAVENOUS | Status: AC
  Administered 2022-02-22: 500 mL via INTRAVENOUS

## 2022-02-22 MED ORDER — KETOROLAC TROMETHAMINE 30 MG/ML IJ SOLN
INTRAMUSCULAR | Status: DC | PRN
  Administered 2022-02-22: 30 mg

## 2022-02-22 MED ORDER — POLYETHYLENE GLYCOL 3350 17 G PO PACK: 17.0000 g | PACK | Freq: Every day | ORAL | 0 refills | Status: AC | PRN

## 2022-02-22 MED ORDER — PROPOFOL 500 MG/50ML IV EMUL
INTRAVENOUS | Status: AC
  Filled 2022-02-22: qty 50

## 2022-02-22 MED ORDER — SODIUM CHLORIDE (PF) 0.9 % IJ SOLN
INTRAMUSCULAR | Status: AC
  Filled 2022-02-22: qty 50

## 2022-02-22 MED ORDER — METHOCARBAMOL 500 MG PO TABS: 500.0000 mg | ORAL_TABLET | Freq: Four times a day (QID) | ORAL | 0 refills | Status: DC | PRN

## 2022-02-22 MED ORDER — OXYCODONE HCL 5 MG PO TABS: 5.0000 mg | ORAL_TABLET | ORAL | 0 refills | Status: AC | PRN

## 2022-02-22 MED ORDER — LACTATED RINGERS IV BOLUS
250.0000 mL | Freq: Once | INTRAVENOUS | Status: AC
  Administered 2022-02-22: 250 mL via INTRAVENOUS

## 2022-02-22 MED ORDER — SENNA 8.6 MG PO TABS: 2.0000 | ORAL_TABLET | Freq: Every day | ORAL | 0 refills | Status: AC

## 2022-02-22 MED ORDER — DOCUSATE SODIUM 100 MG PO CAPS: 100.0000 mg | ORAL_CAPSULE | Freq: Two times a day (BID) | ORAL | 0 refills | Status: AC

## 2022-02-22 MED ORDER — CEFAZOLIN SODIUM-DEXTROSE 2-4 GM/100ML-% IV SOLN
INTRAVENOUS | Status: AC
  Administered 2022-02-22: 2000 mg
  Filled 2022-02-22: qty 100

## 2022-02-22 MED ORDER — MIDAZOLAM HCL 2 MG/2ML IJ SOLN
INTRAMUSCULAR | Status: AC
  Filled 2022-02-22: qty 2

## 2022-02-22 MED ORDER — CEFAZOLIN SODIUM-DEXTROSE 2-4 GM/100ML-% IV SOLN: 2.0000 g | Freq: Four times a day (QID) | INTRAVENOUS | Status: DC

## 2022-02-22 MED ORDER — POVIDONE-IODINE 10 % EX SWAB: 2.0000 | Freq: Once | CUTANEOUS | Status: DC

## 2022-02-22 SURGICAL SUPPLY — 69 items
ADH SKN CLS APL DERMABOND .7 (GAUZE/BANDAGES/DRESSINGS) ×2
APL PRP STRL LF DISP 70% ISPRP (MISCELLANEOUS) ×2
BAG COUNTER SPONGE SURGICOUNT (BAG) IMPLANT
BAG SPEC THK2 15X12 ZIP CLS (MISCELLANEOUS)
BAG SPNG CNTER NS LX DISP (BAG) ×1
BAG ZIPLOCK 12X15 (MISCELLANEOUS) IMPLANT
BATTERY INSTRU NAVIGATION (MISCELLANEOUS) ×3 IMPLANT
BLADE SAW RECIPROCATING 77.5 (BLADE) ×1 IMPLANT
BNDG ELASTIC 4X5.8 VLCR STR LF (GAUZE/BANDAGES/DRESSINGS) ×1 IMPLANT
BNDG ELASTIC 6X5.8 VLCR STR LF (GAUZE/BANDAGES/DRESSINGS) ×1 IMPLANT
BTRY SRG DRVR LF (MISCELLANEOUS) ×3
CHLORAPREP W/TINT 26 (MISCELLANEOUS) ×2 IMPLANT
COMP FEM KNEE PS NRW 8 LT (Joint) ×1 IMPLANT
COMPONENT FEM KNEE PS NRW 8 LT (Joint) IMPLANT
COVER SURGICAL LIGHT HANDLE (MISCELLANEOUS) ×1 IMPLANT
DERMABOND ADVANCED .7 DNX12 (GAUZE/BANDAGES/DRESSINGS) ×2 IMPLANT
DRAPE INCISE IOBAN 66X45 STRL (DRAPES) ×1 IMPLANT
DRAPE SHEET LG 3/4 BI-LAMINATE (DRAPES) ×3 IMPLANT
DRAPE U-SHAPE 47X51 STRL (DRAPES) ×1 IMPLANT
DRSG AQUACEL AG ADV 3.5X10 (GAUZE/BANDAGES/DRESSINGS) ×1 IMPLANT
ELECT BLADE TIP CTD 4 INCH (ELECTRODE) ×1 IMPLANT
ELECT REM PT RETURN 15FT ADLT (MISCELLANEOUS) ×1 IMPLANT
GAUZE SPONGE 4X4 12PLY STRL (GAUZE/BANDAGES/DRESSINGS) ×1 IMPLANT
GLOVE BIO SURGEON STRL SZ7 (GLOVE) ×1 IMPLANT
GLOVE BIO SURGEON STRL SZ8.5 (GLOVE) ×2 IMPLANT
GLOVE BIOGEL PI IND STRL 7.5 (GLOVE) ×1 IMPLANT
GLOVE BIOGEL PI IND STRL 8.5 (GLOVE) ×1 IMPLANT
GOWN SPEC L3 XXLG W/TWL (GOWN DISPOSABLE) ×1 IMPLANT
GOWN STRL REUS W/ TWL XL LVL3 (GOWN DISPOSABLE) ×1 IMPLANT
GOWN STRL REUS W/TWL XL LVL3 (GOWN DISPOSABLE) ×1
HANDPIECE INTERPULSE COAX TIP (DISPOSABLE) ×1
HDLS TROCR DRIL PIN KNEE 75 (PIN) ×1
HOLDER FOLEY CATH W/STRAP (MISCELLANEOUS) ×1 IMPLANT
HOOD PEEL AWAY FLYTE STAYCOOL (MISCELLANEOUS) ×3 IMPLANT
IMPL PATELLA METAL SZ32X10 (Joint) IMPLANT
KIT TURNOVER KIT A (KITS) IMPLANT
LINER TIB PS CD/8-9 10 LT (Liner) IMPLANT
MARKER SKIN DUAL TIP RULER LAB (MISCELLANEOUS) ×1 IMPLANT
NDL SAFETY ECLIP 18X1.5 (MISCELLANEOUS) ×1 IMPLANT
NDL SPNL 18GX3.5 QUINCKE PK (NEEDLE) ×1 IMPLANT
NEEDLE SPNL 18GX3.5 QUINCKE PK (NEEDLE) ×1 IMPLANT
NS IRRIG 1000ML POUR BTL (IV SOLUTION) ×1 IMPLANT
PACK ICE MAXI GEL EZY WRAP (MISCELLANEOUS) IMPLANT
PACK TOTAL KNEE CUSTOM (KITS) ×1 IMPLANT
PADDING CAST COTTON 6X4 STRL (CAST SUPPLIES) ×1 IMPLANT
PIN DRILL HDLS TROCAR 75 4PK (PIN) IMPLANT
PROTECTOR NERVE ULNAR (MISCELLANEOUS) ×1 IMPLANT
SAW OSC TIP CART 19.5X105X1.3 (SAW) ×1 IMPLANT
SCREW FEMALE HEX FIX 25X2.5 (ORTHOPEDIC DISPOSABLE SUPPLIES) IMPLANT
SEALER BIPOLAR AQUA 6.0 (INSTRUMENTS) ×1 IMPLANT
SET HNDPC FAN SPRY TIP SCT (DISPOSABLE) ×1 IMPLANT
SET PAD KNEE POSITIONER (MISCELLANEOUS) ×1 IMPLANT
SOLUTION PRONTOSAN WOUND 350ML (IRRIGATION / IRRIGATOR) IMPLANT
SPIKE FLUID TRANSFER (MISCELLANEOUS) ×2 IMPLANT
STEM TIB PS KNEE D 0D LT (Stem) IMPLANT
SUT MNCRL AB 3-0 PS2 18 (SUTURE) ×1 IMPLANT
SUT MNCRL AB 4-0 PS2 18 (SUTURE) IMPLANT
SUT MON AB 2-0 CT1 36 (SUTURE) ×1 IMPLANT
SUT STRATAFIX PDO 1 14 VIOLET (SUTURE) ×1
SUT STRATFX PDO 1 14 VIOLET (SUTURE) ×1
SUT VIC AB 1 CTX 36 (SUTURE) ×2
SUT VIC AB 1 CTX36XBRD ANBCTR (SUTURE) ×2 IMPLANT
SUT VIC AB 2-0 CT1 27 (SUTURE)
SUT VIC AB 2-0 CT1 TAPERPNT 27 (SUTURE) IMPLANT
SUTURE STRATFX PDO 1 14 VIOLET (SUTURE) ×1 IMPLANT
TRAY FOLEY MTR SLVR 14FR STAT (SET/KITS/TRAYS/PACK) IMPLANT
TRAY FOLEY MTR SLVR 16FR STAT (SET/KITS/TRAYS/PACK) IMPLANT
TUBE SUCTION HIGH CAP CLEAR NV (SUCTIONS) ×1 IMPLANT
WATER STERILE IRR 1000ML POUR (IV SOLUTION) ×2 IMPLANT

## 2022-02-22 NOTE — Anesthesia Procedure Notes (Signed)
Spinal  Start time: 02/22/2022 7:30 AM End time: 02/22/2022 7:35 AM Staffing Performed: resident/CRNA  Resident/CRNA: Vanessa [place], CRNA Performed by: Vanessa [place], CRNA Authorized by: Lannie Fields, DO   Preanesthetic Checklist Completed: patient identified, IV checked, site marked, risks and benefits discussed, surgical consent, monitors and equipment checked, pre-op evaluation and timeout performed Spinal Block Patient position: sitting Prep: DuraPrep Patient monitoring: heart rate, cardiac monitor, continuous pulse ox and blood pressure Approach: midline Location: L4-5 Injection technique: single-shot Needle Needle type: Pencan  Needle gauge: 24 G Needle length: 10 cm Needle insertion depth: 7 cm Assessment Sensory level: T6 Events: CSF return Additional Notes Expiration dates checked, pt tolerated without diff    free flowing csf

## 2022-02-22 NOTE — Transfer of Care (Signed)
Immediate Anesthesia Transfer of Care Note  Patient: Laurie Cooke  Procedure(s) Performed: COMPUTER ASSISTED TOTAL KNEE ARTHROPLASTY (Left: Knee)  Patient Location: PACU  Anesthesia Type:Spinal  Level of Consciousness: awake and patient cooperative  Airway & Oxygen Therapy: Patient Spontanous Breathing and Patient connected to face mask  Post-op Assessment: Report given to RN and Post -op Vital signs reviewed and stable  Post vital signs: Reviewed and stable  Last Vitals:  Vitals Value Taken Time  BP 127/71 02/22/22 1025  Temp    Pulse 79 02/22/22 1026  Resp 24 02/22/22 1026  SpO2 100 % 02/22/22 1026  Vitals shown include unvalidated device data.  Last Pain:  Vitals:   02/22/22 0542  TempSrc:   PainSc: 0-No pain      Patients Stated Pain Goal: 5 (02/22/22 0542)  Complications: No notable events documented.

## 2022-02-22 NOTE — Evaluation (Signed)
Physical Therapy Evaluation Patient Details Name: Laurie Cooke MRN: [id number] DOB: 1956/02/24 Today's Date: 02/22/2022  History of Present Illness  Pt s/p L TKR and with hx of L THR  Clinical Impression  Pt s/p L TKR and presents with decreased L LE strength/ROM and post op pain limiting functional mobility.  This date, pt demonstrates all basic mobility tasks with min guard/sup assist including ambulation x 100 ft with RW and up/down stairs with spouse assisting at min assist level.  Pt performed HEP with written instruction provided and reviewed.  Pt reports OP PT to start Monday and will call to clarify time of appt this date.     Recommendations for follow up therapy are one component of a multi-disciplinary discharge planning process, led by the attending physician.  Recommendations may be updated based on patient status, additional functional criteria and insurance authorization.  Follow Up Recommendations Follow physician's recommendations for discharge plan and follow up therapies      Assistance Recommended at Discharge Intermittent Supervision/Assistance  Patient can return home with the following  A little help with walking and/or transfers;A little help with bathing/dressing/bathroom;Assistance with cooking/housework;Help with stairs or ramp for entrance;Assist for transportation    Equipment Recommendations None recommended by PT  Recommendations for Other Services       Functional Status Assessment Patient has had a recent decline in their functional status and demonstrates the ability to make significant improvements in function in a reasonable and predictable amount of time.     Precautions / Restrictions Precautions Precautions: Fall;Knee Restrictions Weight Bearing Restrictions: No Other Position/Activity Restrictions: WBAT      Mobility  Bed Mobility Overal bed mobility: Needs Assistance Bed Mobility: Supine to Sit     Supine to sit: Supervision      General bed mobility comments: for safety    Transfers Overall transfer level: Needs assistance Equipment used: Rolling walker (2 wheels) Transfers: Sit to/from Stand Sit to Stand: Min guard           General transfer comment: cues for LE management and use of UEs to self assist    Ambulation/Gait Ambulation/Gait assistance: Min assist, Min guard Gait Distance (Feet): 100 Feet Assistive device: Rolling walker (2 wheels) Gait Pattern/deviations: Step-to pattern, Decreased step length - right, Decreased step length - left, Shuffle, Trunk flexed Gait velocity: decr     General Gait Details: cues for sequence, posture and position from RW  Stairs Stairs: Yes Stairs assistance: Min assist Stair Management: No rails, Step to pattern, Backwards, With walker Number of Stairs: 4 General stair comments: 2 steps twice with spouse assisting on second attempt  Wheelchair Mobility    Modified Rankin (Stroke Patients Only)       Balance Overall balance assessment: Needs assistance Sitting-balance support: No upper extremity supported, Feet supported Sitting balance-Leahy Scale: Good     Standing balance support: Single extremity supported Standing balance-Leahy Scale: Poor                               Pertinent Vitals/Pain Pain Assessment Pain Assessment: 0-10 Pain Score: 3  Pain Location: L knee Pain Descriptors / Indicators: Aching, Sore Pain Intervention(s): Limited activity within patient's tolerance, Monitored during session, Premedicated before session    Home Living Family/patient expects to be discharged to:: Private residence Living Arrangements: Spouse/significant other Available Help at Discharge: Family;Available 24 hours/day Type of Home: House Home Access: Stairs to enter Entrance Stairs-Rails: None  Entrance Stairs-Number of Steps: 2 Alternate Level Stairs-Number of Steps: 12 Home Layout: Two level;Able to live on main level with  bedroom/bathroom Home Equipment: Rolling Walker (2 wheels);Cane - single point      Prior Function Prior Level of Function : Independent/Modified Independent             Mobility Comments: using cane       Hand Dominance        Extremity/Trunk Assessment   Upper Extremity Assessment Upper Extremity Assessment: Overall WFL for tasks assessed    Lower Extremity Assessment Lower Extremity Assessment: LLE deficits/detail LLE Deficits / Details: IND SLR;  AAROM -4 - 65 at knee    Cervical / Trunk Assessment Cervical / Trunk Assessment: Normal  Communication   Communication: No difficulties  Cognition Arousal/Alertness: Awake/alert Behavior During Therapy: WFL for tasks assessed/performed Overall Cognitive Status: Within Functional Limits for tasks assessed                                          General Comments      Exercises Total Joint Exercises Ankle Circles/Pumps: AROM, Both, 15 reps, Supine Quad Sets: AROM, Both, 10 reps, Supine Heel Slides: AAROM, Left, 15 reps, Supine Straight Leg Raises: AAROM, AROM, Left, 15 reps, Supine Long Arc Quad: AROM, Left, 10 reps, Seated   Assessment/Plan    PT Assessment Patient needs continued PT services  PT Problem List Decreased strength;Decreased range of motion;Decreased activity tolerance;Decreased balance;Decreased mobility;Decreased knowledge of use of DME;Pain       PT Treatment Interventions DME instruction;Gait training;Stair training;Functional mobility training;Therapeutic activities;Therapeutic exercise;Balance training;Patient/family education    PT Goals (Current goals can be found in the Care Plan section)  Acute Rehab PT Goals Patient Stated Goal: REgain IND PT Goal Formulation: With patient Time For Goal Achievement: 02/24/22 Potential to Achieve Goals: Good    Frequency 7X/week     Co-evaluation               AM-PAC PT "6 Clicks" Mobility  Outcome Measure Help needed  turning from your back to your side while in a flat bed without using bedrails?: A Little Help needed moving from lying on your back to sitting on the side of a flat bed without using bedrails?: A Little Help needed moving to and from a bed to a chair (including a wheelchair)?: A Little Help needed standing up from a chair using your arms (e.g., wheelchair or bedside chair)?: A Little Help needed to walk in hospital room?: A Little Help needed climbing 3-5 steps with a railing? : A Little 6 Click Score: 18    End of Session Equipment Utilized During Treatment: Gait belt Activity Tolerance: Patient tolerated treatment well Patient left: in chair;with call bell/phone within reach;with family/visitor present Nurse Communication: Mobility status PT Visit Diagnosis: Difficulty in walking, not elsewhere classified (R26.2)    Time: 5427-0623 PT Time Calculation (min) (ACUTE ONLY): 48 min   Charges:   PT Evaluation $PT Eval Low Complexity: 1 Low PT Treatments $Gait Training: 8-22 mins $Therapeutic Exercise: 8-22 mins        Mauro Kaufmann PT Acute Rehabilitation Services Pager [phone] Office [phone]   Elissia Spiewak 02/22/2022, 2:59 PM

## 2022-02-22 NOTE — Anesthesia Postprocedure Evaluation (Signed)
Anesthesia Post Note  Patient: Kem Boroughs  Procedure(s) Performed: COMPUTER ASSISTED TOTAL KNEE ARTHROPLASTY (Left: Knee)     Patient location during evaluation: PACU Anesthesia Type: Regional, MAC and Spinal Level of consciousness: awake and alert, oriented and patient cooperative Pain management: pain level controlled Vital Signs Assessment: post-procedure vital signs reviewed and stable Respiratory status: spontaneous breathing, nonlabored ventilation and respiratory function stable Cardiovascular status: blood pressure returned to baseline and stable Postop Assessment: no apparent nausea or vomiting Anesthetic complications: no   No notable events documented.  Last Vitals:  Vitals:   02/22/22 1115 02/22/22 1130  BP: 127/82 133/80  Pulse: 60 70  Resp: 18 16  Temp:    SpO2: 100% 99%    Last Pain:  Vitals:   02/22/22 1130  TempSrc:   PainSc: 0-No pain                 Pervis Hocking

## 2022-02-22 NOTE — Anesthesia Procedure Notes (Signed)
Procedure Name: MAC Date/Time: 02/22/2022 7:31 AM  Performed by: Claudia Desanctis, CRNAPre-anesthesia Checklist: Patient identified, Emergency Drugs available, Suction available and Patient being monitored Patient Re-evaluated:Patient Re-evaluated prior to induction Oxygen Delivery Method: Simple face mask

## 2022-02-22 NOTE — Op Note (Signed)
OPERATIVE REPORT  SURGEON: Rod Can, MD   ASSISTANT: Larene Pickett, PA-C  PREOPERATIVE DIAGNOSIS: Primary Left knee arthritis.   POSTOPERATIVE DIAGNOSIS: Primary Left knee arthritis.   PROCEDURE: Computer assisted Left total knee arthroplasty.   IMPLANTS: Zimmer Persona PPS Cementless CR femur, size 8 Narrow. Persona 0 degree Spiked Keel OsseoTi Tibia, size D. Vivacit-E polyethelyene insert, size 11 mm, MC. TM standard patella, size 32 mm.  ANESTHESIA:  MAC, Regional, and Spinal  TOURNIQUET TIME: Not utilized.   ESTIMATED BLOOD LOSS:-50 mL    ANTIBIOTICS: 2g Ancef.  DRAINS: None.  COMPLICATIONS: None   CONDITION: PACU - hemodynamically stable.   BRIEF CLINICAL NOTE: Laurie Cooke is a 66 y.o. female with a long-standing history of Left knee arthritis. After failing conservative management, the patient was indicated for total knee arthroplasty. The risks, benefits, and alternatives to the procedure were explained, and the patient elected to proceed.  PROCEDURE IN DETAIL: Adductor canal block was obtained in the pre-op holding area. Once inside the operative room, spinal anesthesia was obtained, and a foley catheter was inserted. The patient was then positioned and the lower extremity was prepped and draped in the normal sterile surgical fashion.  A time-out was called verifying side and site of surgery. The patient received IV antibiotics within 60 minutes of beginning the procedure. A tourniquet was not utilized.   An anterior approach to the knee was performed utilizing a midvastus arthrotomy. A medial release was performed and the patellar fat pad was excised. Stryker imageless navigation was used to cut the distal femur perpendicular to the mechanical axis. A freehand patellar resection was performed, and the patella was sized an prepared with 3 lug holes.  Nagivation was used to make a neutral proximal tibia resection, taking 9 mm of bone from the less affected lateral side  with 3 degrees of slope. The menisci were excised. A spacer block was placed, and the alignment and balance in extension were confirmed.   The distal femur was sized using the 3-degree external rotation guide referencing the posterior femoral cortex. The appropriate 4-in-1 cutting block was pinned into place. Rotation was checked using Whiteside's line, the epicondylar axis, and then confirmed with a spacer block in flexion. The remaining femoral cuts were performed, taking care to protect the MCL.  The tibia was sized and the trial tray was pinned into place. The remaining trail components were inserted. The knee was stable to varus and valgus stress through a full range of motion. The patella tracked centrally, and the PCL was well balanced. The trial components were removed, and the proximal tibial surface was prepared. Final components were impacted into place. The knee was tested for a final time and found to be well balanced.   The wound was copiously irrigated with Prontosan solution and normal saline using pule lavage.  Marcaine solution was injected into the periarticular soft tissue.  The wound was closed in layers using #1 Vicryl and Stratafix for the fascia, 2-0 Vicryl for the subcutaneous fat, 2-0 Monocryl for the deep dermal layer, 3-0 running Monocryl subcuticular Stitch, and 4-0 Monocryl stay sutures at both ends of the wound. Dermabond was applied to the skin.  Once the glue was fully dried, an Aquacell Ag and compressive dressing were applied.  The patient was transported to the recovery room in stable condition.  Sponge, needle, and instrument counts were correct at the end of the case x2.  The patient tolerated the procedure well and there were no known  complications.  Please note that a surgical assistant was a medical necessity for this procedure in order to perform it in a safe and expeditious manner. Surgical assistant was necessary to retract the ligaments and vital neurovascular  structures to prevent injury to them and also necessary for proper positioning of the limb to allow for anatomic placement of the prosthesis.

## 2022-02-22 NOTE — Anesthesia Procedure Notes (Signed)
Anesthesia Regional Block: Adductor canal block   Pre-Anesthetic Checklist: , timeout performed,  Correct Patient, Correct Site, Correct Laterality,  Correct Procedure, Correct Position, site marked,  Risks and benefits discussed,  Surgical consent,  Pre-op evaluation,  At surgeon's request and post-op pain management  Laterality: Left  Prep: Maximum Sterile Barrier Precautions used, chloraprep       Needles:  Injection technique: Single-shot  Needle Type: Echogenic Stimulator Needle     Needle Length: 9cm  Needle Gauge: 22     Additional Needles:   Procedures:,,,, ultrasound used (permanent image in chart),,    Narrative:  Start time: 02/22/2022 7:05 AM End time: 02/22/2022 7:10 AM Injection made incrementally with aspirations every 5 mL.  Performed by: Personally  Anesthesiologist: Lannie Fields, DO  Additional Notes: Monitors applied. No increased pain on injection. No increased resistance to injection. Injection made in 5cc increments. Good needle visualization. Patient tolerated procedure well.

## 2022-02-22 NOTE — Discharge Instructions (Signed)
 Dr. Brian Swinteck Total Joint Specialist [facility] [address] [place], [place] [address] [phone]  TOTAL KNEE REPLACEMENT POSTOPERATIVE DIRECTIONS    Knee Rehabilitation, Guidelines Following Surgery  Results after knee surgery are often greatly improved when you follow the exercise, range of motion and muscle strengthening exercises prescribed by your doctor. Safety measures are also important to protect the knee from further injury. Any time any of these exercises cause you to have increased pain or swelling in your knee joint, decrease the amount until you are comfortable again and slowly increase them. If you have problems or questions, call your caregiver or physical therapist for advice.   WEIGHT BEARING Weight bearing as tolerated with assist device (walker, cane, etc) as directed, use it as long as suggested by your surgeon or therapist, typically at least 4-6 weeks.  HOME CARE INSTRUCTIONS  Remove items at home which could result in a fall. This includes throw rugs or furniture in walking pathways.  Continue medications as instructed at time of discharge. You may have some home medications which will be placed on hold until you complete the course of blood thinner medication.  You may start showering once you are discharged home but do not submerge the incision under water. Just pat the incision dry and apply a dry gauze dressing on daily. Walk with walker as instructed.  You may resume a sexual relationship in one month or when given the OK by your doctor.  Use walker as long as suggested by your caregivers. Avoid periods of inactivity such as sitting longer than an hour when not asleep. This helps prevent blood clots.  You may put full weight on your legs and walk as much as is comfortable.  You may return to work once you are cleared by your doctor.  Do not drive a car for 6 weeks or until released by you surgeon.  Do not drive while  taking narcotics.  Wear the elastic stockings for three weeks following surgery during the day but you may remove then at night. Make sure you keep all of your appointments after your operation with all of your doctors and caregivers. You should call the office at the above phone number and make an appointment for approximately two weeks after the date of your surgery. Do not remove your surgical dressing. The dressing is waterproof; you may take showers in 3 days, but do not take tub baths or submerge the dressing. Please pick up a stool softener and laxative for home use as long as you are requiring pain medications. ICE to the affected knee every three hours for 30 minutes at a time and then as needed for pain and swelling.  Continue to use ice on the knee for pain and swelling from surgery. You may notice swelling that will progress down to the foot and ankle.  This is normal after surgery.  Elevate the leg when you are not up walking on it.   It is important for you to complete the blood thinner medication as prescribed by your doctor. Continue to use the breathing machine which will help keep your temperature down.  It is common for your temperature to cycle up and down following surgery, especially at night when you are not up moving around and exerting yourself.  The breathing machine keeps your lungs expanded and your temperature down.  RANGE OF MOTION AND STRENGTHENING EXERCISES  Rehabilitation of the knee is important following a knee injury or an   operation. After just a few days of immobilization, the muscles of the thigh which control the knee become weakened and shrink (atrophy). Knee exercises are designed to build up the tone and strength of the thigh muscles and to improve knee motion. Often times heat used for twenty to thirty minutes before working out will loosen up your tissues and help with improving the range of motion but do not use heat for the first two weeks following surgery.  These exercises can be done on a training (exercise) mat, on the floor, on a table or on a bed. Use what ever works the best and is most comfortable for you Knee exercises include:  Leg Lifts - While your knee is still immobilized in a splint or cast, you can do straight leg raises. Lift the leg to 60 degrees, hold for 3 sec, and slowly lower the leg. Repeat 10-20 times 2-3 times daily. Perform this exercise against resistance later as your knee gets better.  Quad and Hamstring Sets - Tighten up the muscle on the front of the thigh (Quad) and hold for 5-10 sec. Repeat this 10-20 times hourly. Hamstring sets are done by pushing the foot backward against an object and holding for 5-10 sec. Repeat as with quad sets.  A rehabilitation program following serious knee injuries can speed recovery and prevent re-injury in the future due to weakened muscles. Contact your doctor or a physical therapist for more information on knee rehabilitation.   POST-OPERATIVE OPIOID TAPER INSTRUCTIONS: It is important to wean off of your opioid medication as soon as possible. If you do not need pain medication after your surgery it is ok to stop day one. Opioids include: Codeine, Hydrocodone(Norco, Vicodin), Oxycodone(Percocet, oxycontin) and hydromorphone amongst others.  Long term and even short term use of opiods can cause: Increased pain response Dependence Constipation Depression Respiratory depression And more.  Withdrawal symptoms can include Flu like symptoms Nausea, vomiting And more Techniques to manage these symptoms Hydrate well Eat regular healthy meals Stay active Use relaxation techniques(deep breathing, meditating, yoga) Do Not substitute Alcohol to help with tapering If you have been on opioids for less than two weeks and do not have pain than it is ok to stop all together.  Plan to wean off of opioids This plan should start within one week post op of your joint replacement. Maintain the same  interval or time between taking each dose and first decrease the dose.  Cut the total daily intake of opioids by one tablet each day Next start to increase the time between doses. The last dose that should be eliminated is the evening dose.    SKILLED REHAB INSTRUCTIONS: If the patient is transferred to a skilled rehab facility following release from the hospital, a list of the current medications will be sent to the facility for the patient to continue.  When discharged from the skilled rehab facility, please have the facility set up the patient's Home Health Physical Therapy prior to being released. Also, the skilled facility will be responsible for providing the patient with their medications at time of release from the facility to include their pain medication, the muscle relaxants, and their blood thinner medication. If the patient is still at the rehab facility at time of the two week follow up appointment, the skilled rehab facility will also need to assist the patient in arranging follow up appointment in our office and any transportation needs.  MAKE SURE YOU:  Understand these instructions.  Will watch   your condition.  Will get help right away if you are not doing well or get worse.    Pick up stool softner and laxative for home use following surgery while on pain medications. Do NOT remove your dressing. You may shower.  Do not take tub baths or submerge incision under water. May shower starting three days after surgery. Please use a clean towel to pat the incision dry following showers. Continue to use ice for pain and swelling after surgery. Do not use any lotions or creams on the incision until instructed by your surgeon.  

## 2022-02-22 NOTE — Interval H&P Note (Signed)
History and Physical Interval Note:  02/22/2022 7:23 AM  Laurie Cooke  has presented today for surgery, with the diagnosis of Left knee osteoarthritis.  The various methods of treatment have been discussed with the patient and family. After consideration of risks, benefits and other options for treatment, the patient has consented to  Procedure(s) with comments: COMPUTER ASSISTED TOTAL KNEE ARTHROPLASTY (Left) - 150 as a surgical intervention.  The patient's history has been reviewed, patient examined, no change in status, stable for surgery.  I have reviewed the patient's chart and labs.  Questions were answered to the patient's satisfaction.     Iline Oven Tabetha Haraway

## 2022-02-23 ENCOUNTER — Encounter (HOSPITAL_COMMUNITY): Payer: Self-pay | Admitting: Orthopedic Surgery

## 2023-08-29 ENCOUNTER — Ambulatory Visit: Payer: Self-pay | Admitting: Student

## 2023-09-06 ENCOUNTER — Encounter (HOSPITAL_COMMUNITY): Admission: RE | Admit: 2023-09-06 | Payer: Self-pay | Source: Ambulatory Visit

## 2023-09-18 ENCOUNTER — Ambulatory Visit: Admit: 2023-09-18 | Payer: PRIVATE HEALTH INSURANCE | Admitting: Orthopedic Surgery

## 2023-09-18 SURGERY — ARTHROPLASTY, KNEE, TOTAL, USING IMAGELESS COMPUTER-ASSISTED NAVIGATION
Anesthesia: Spinal | Site: Knee | Laterality: Right

## 2023-12-06 ENCOUNTER — Other Ambulatory Visit: Payer: Self-pay | Admitting: Family Medicine

## 2023-12-06 DIAGNOSIS — R1310 Dysphagia, unspecified: Secondary | ICD-10-CM

## 2024-02-16 ENCOUNTER — Emergency Department (HOSPITAL_COMMUNITY)

## 2024-02-16 ENCOUNTER — Inpatient Hospital Stay (HOSPITAL_COMMUNITY)
Admission: EM | Admit: 2024-02-16 | Discharge: 2024-02-20 | DRG: 299 | Disposition: A | Discharge status: Home / Self Care | Attending: Family Medicine | Admitting: Family Medicine

## 2024-02-16 ENCOUNTER — Other Ambulatory Visit: Payer: Self-pay

## 2024-02-16 ENCOUNTER — Encounter (HOSPITAL_COMMUNITY): Payer: Self-pay | Admitting: Internal Medicine

## 2024-02-16 DIAGNOSIS — I2699 Other pulmonary embolism without acute cor pulmonale: Principal | ICD-10-CM | POA: Diagnosis present

## 2024-02-16 DIAGNOSIS — K219 Gastro-esophageal reflux disease without esophagitis: Secondary | ICD-10-CM | POA: Diagnosis present

## 2024-02-16 DIAGNOSIS — Z791 Long term (current) use of non-steroidal anti-inflammatories (NSAID): Secondary | ICD-10-CM

## 2024-02-16 DIAGNOSIS — I82441 Acute embolism and thrombosis of right tibial vein: Principal | ICD-10-CM | POA: Diagnosis present

## 2024-02-16 DIAGNOSIS — Z833 Family history of diabetes mellitus: Secondary | ICD-10-CM

## 2024-02-16 DIAGNOSIS — Z96653 Presence of artificial knee joint, bilateral: Secondary | ICD-10-CM | POA: Diagnosis present

## 2024-02-16 DIAGNOSIS — Z79899 Other long term (current) drug therapy: Secondary | ICD-10-CM

## 2024-02-16 DIAGNOSIS — J9811 Atelectasis: Secondary | ICD-10-CM | POA: Diagnosis present

## 2024-02-16 DIAGNOSIS — R7303 Prediabetes: Secondary | ICD-10-CM | POA: Diagnosis present

## 2024-02-16 DIAGNOSIS — R000 Tachycardia, unspecified: Secondary | ICD-10-CM | POA: Diagnosis present

## 2024-02-16 DIAGNOSIS — Z8249 Family history of ischemic heart disease and other diseases of the circulatory system: Secondary | ICD-10-CM

## 2024-02-16 DIAGNOSIS — Z96642 Presence of left artificial hip joint: Secondary | ICD-10-CM | POA: Diagnosis present

## 2024-02-16 DIAGNOSIS — H401121 Primary open-angle glaucoma, left eye, mild stage: Secondary | ICD-10-CM | POA: Diagnosis present

## 2024-02-16 DIAGNOSIS — D509 Iron deficiency anemia, unspecified: Secondary | ICD-10-CM | POA: Diagnosis present

## 2024-02-16 DIAGNOSIS — I2609 Other pulmonary embolism with acute cor pulmonale: Secondary | ICD-10-CM | POA: Diagnosis not present

## 2024-02-16 DIAGNOSIS — Z6834 Body mass index (BMI) 34.0-34.9, adult: Secondary | ICD-10-CM

## 2024-02-16 DIAGNOSIS — I10 Essential (primary) hypertension: Secondary | ICD-10-CM | POA: Diagnosis present

## 2024-02-16 DIAGNOSIS — I82409 Acute embolism and thrombosis of unspecified deep veins of unspecified lower extremity: Secondary | ICD-10-CM | POA: Insufficient documentation

## 2024-02-16 DIAGNOSIS — E66811 Obesity, class 1: Secondary | ICD-10-CM | POA: Diagnosis present

## 2024-02-16 LAB — CBC
HCT: 38.4 % (ref 36.0–46.0)
Hemoglobin: 11.5 g/dL — ABNORMAL LOW (ref 12.0–15.0)
MCH: 25.2 pg — ABNORMAL LOW (ref 26.0–34.0)
MCHC: 29.9 g/dL — ABNORMAL LOW (ref 30.0–36.0)
MCV: 84 fL (ref 80.0–100.0)
Platelets: 293 K/uL (ref 150–400)
RBC: 4.57 MIL/uL (ref 3.87–5.11)
RDW: 15.1 % (ref 11.5–15.5)
WBC: 9.1 K/uL (ref 4.0–10.5)
nRBC: 0 % (ref 0.0–0.2)

## 2024-02-16 LAB — COMPREHENSIVE METABOLIC PANEL WITH GFR
ALT: 25 U/L (ref 0–44)
AST: 18 U/L (ref 15–41)
Albumin: 3.9 g/dL (ref 3.5–5.0)
Alkaline Phosphatase: 114 U/L (ref 38–126)
Anion gap: 12 (ref 5–15)
BUN: 15 mg/dL (ref 8–23)
CO2: 27 mmol/L (ref 22–32)
Calcium: 9.8 mg/dL (ref 8.9–10.3)
Chloride: 99 mmol/L (ref 98–111)
Creatinine, Ser: 0.95 mg/dL (ref 0.44–1.00)
GFR, Estimated: >60 mL/min (ref >60)
Glucose, Bld: 153 mg/dL — ABNORMAL HIGH (ref 70–99)
Potassium: 3.6 mmol/L (ref 3.5–5.1)
Sodium: 138 mmol/L (ref 135–145)
Total Bilirubin: 1 mg/dL (ref 0.0–1.2)
Total Protein: 7.7 g/dL (ref 6.5–8.1)

## 2024-02-16 LAB — URINALYSIS, ROUTINE W REFLEX MICROSCOPIC
Bilirubin Urine: NEGATIVE
Glucose, UA: NEGATIVE mg/dL
Ketones, ur: NEGATIVE mg/dL
Nitrite: NEGATIVE
Protein, ur: NEGATIVE mg/dL
Specific Gravity, Urine: 1.019 (ref 1.005–1.030)
pH: 5 (ref 5.0–8.0)

## 2024-02-16 LAB — LIPASE, BLOOD: Lipase: 17 U/L (ref 11–51)

## 2024-02-16 LAB — PHOSPHORUS: Phosphorus: 2.2 mg/dL — ABNORMAL LOW (ref 2.5–4.6)

## 2024-02-16 LAB — TROPONIN T, HIGH SENSITIVITY
Troponin T High Sensitivity: <15 ng/L (ref 0–19)
Troponin T High Sensitivity: <15 ng/L (ref 0–19)

## 2024-02-16 LAB — PRO BRAIN NATRIURETIC PEPTIDE: Pro Brain Natriuretic Peptide: <50 pg/mL (ref <300.0)

## 2024-02-16 LAB — MAGNESIUM: Magnesium: 1.7 mg/dL (ref 1.7–2.4)

## 2024-02-16 LAB — HEPARIN LEVEL (UNFRACTIONATED): Heparin Unfractionated: 0.18 [IU]/mL — ABNORMAL LOW (ref 0.30–0.70)

## 2024-02-16 LAB — PROCALCITONIN: Procalcitonin: <0.1 ng/mL

## 2024-02-16 MED ORDER — HEPARIN BOLUS VIA INFUSION
2000.0000 [IU] | Freq: Once | INTRAVENOUS | Status: AC
  Administered 2024-02-16: 2000 [IU] via INTRAVENOUS
  Filled 2024-02-16: qty 2000

## 2024-02-16 MED ORDER — HEPARIN (PORCINE) 25000 UT/250ML-% IV SOLN
1250.0000 [IU]/h | INTRAVENOUS | Status: DC
  Administered 2024-02-16: 1100 [IU]/h via INTRAVENOUS
  Administered 2024-02-17 – 2024-02-19 (×3): 1350 [IU]/h via INTRAVENOUS
  Filled 2024-02-16 (×3): qty 250

## 2024-02-16 MED ORDER — ONDANSETRON HCL 4 MG/2ML IJ SOLN: 4.0000 mg | Freq: Four times a day (QID) | INTRAMUSCULAR | Status: DC | PRN

## 2024-02-16 MED ORDER — FERROUS SULFATE 325 (65 FE) MG PO TABS
325.0000 mg | ORAL_TABLET | Freq: Every day | ORAL | Status: DC
  Administered 2024-02-17 – 2024-02-20 (×4): 325 mg via ORAL
  Filled 2024-02-16 (×4): qty 1

## 2024-02-16 MED ORDER — IOHEXOL 300 MG/ML  SOLN
100.0000 mL | Freq: Once | INTRAMUSCULAR | Status: AC | PRN
  Administered 2024-02-16: 100 mL via INTRAVENOUS

## 2024-02-16 MED ORDER — TIMOLOL MALEATE 0.5 % OP SOLN
1.0000 [drp] | Freq: Two times a day (BID) | OPHTHALMIC | Status: DC
  Administered 2024-02-16 – 2024-02-20 (×8): 1 [drp] via OPHTHALMIC
  Filled 2024-02-16: qty 5

## 2024-02-16 MED ORDER — ACETAMINOPHEN 650 MG RE SUPP: 650.0000 mg | Freq: Four times a day (QID) | RECTAL | Status: DC | PRN

## 2024-02-16 MED ORDER — SODIUM CHLORIDE 0.9 % IV BOLUS
1000.0000 mL | Freq: Once | INTRAVENOUS | Status: AC
  Administered 2024-02-16: 1000 mL via INTRAVENOUS

## 2024-02-16 MED ORDER — K PHOS MONO-SOD PHOS DI & MONO 155-852-130 MG PO TABS
500.0000 mg | ORAL_TABLET | Freq: Four times a day (QID) | ORAL | Status: AC
  Administered 2024-02-16 (×2): 500 mg via ORAL
  Filled 2024-02-16 (×2): qty 2

## 2024-02-16 MED ORDER — FENTANYL CITRATE PF 50 MCG/ML IJ SOSY
25.0000 ug | PREFILLED_SYRINGE | INTRAMUSCULAR | Status: DC | PRN
  Administered 2024-02-16 – 2024-02-17 (×2): 25 ug via INTRAVENOUS
  Filled 2024-02-16 (×2): qty 1

## 2024-02-16 MED ORDER — FENTANYL CITRATE PF 50 MCG/ML IJ SOSY
50.0000 ug | PREFILLED_SYRINGE | Freq: Once | INTRAMUSCULAR | Status: AC
  Administered 2024-02-16: 50 ug via INTRAVENOUS
  Filled 2024-02-16: qty 1

## 2024-02-16 MED ORDER — MAGNESIUM SULFATE 2 GM/50ML IV SOLN
2.0000 g | Freq: Once | INTRAVENOUS | Status: AC
  Administered 2024-02-16: 2 g via INTRAVENOUS
  Filled 2024-02-16: qty 50

## 2024-02-16 MED ORDER — SODIUM CHLORIDE 0.9 % IV BOLUS
500.0000 mL | Freq: Once | INTRAVENOUS | Status: AC
  Administered 2024-02-16: 500 mL via INTRAVENOUS

## 2024-02-16 MED ORDER — ACETAMINOPHEN 325 MG PO TABS: 650.0000 mg | ORAL_TABLET | Freq: Four times a day (QID) | ORAL | Status: DC | PRN

## 2024-02-16 MED ORDER — IOHEXOL 350 MG/ML SOLN
80.0000 mL | Freq: Once | INTRAVENOUS | Status: AC | PRN
  Administered 2024-02-16: 80 mL via INTRAVENOUS

## 2024-02-16 MED ORDER — ENSURE PLUS HIGH PROTEIN PO LIQD: 237.0000 mL | Freq: Two times a day (BID) | ORAL | Status: DC

## 2024-02-16 MED ORDER — OXYCODONE HCL 5 MG PO TABS
5.0000 mg | ORAL_TABLET | ORAL | Status: DC | PRN
  Administered 2024-02-16 – 2024-02-18 (×3): 5 mg via ORAL
  Filled 2024-02-16 (×3): qty 1

## 2024-02-16 MED ORDER — POTASSIUM CHLORIDE CRYS ER 20 MEQ PO TBCR
40.0000 meq | EXTENDED_RELEASE_TABLET | Freq: Once | ORAL | Status: AC
  Administered 2024-02-16: 40 meq via ORAL
  Filled 2024-02-16: qty 2

## 2024-02-16 MED ORDER — LATANOPROST 0.005 % OP SOLN
1.0000 [drp] | Freq: Every day | OPHTHALMIC | Status: DC
  Administered 2024-02-16 – 2024-02-19 (×4): 1 [drp] via OPHTHALMIC
  Filled 2024-02-16: qty 2.5

## 2024-02-16 MED ORDER — ONDANSETRON HCL 4 MG/2ML IJ SOLN
4.0000 mg | Freq: Once | INTRAMUSCULAR | Status: AC
  Administered 2024-02-16: 4 mg via INTRAVENOUS
  Filled 2024-02-16: qty 2

## 2024-02-16 MED ORDER — ONDANSETRON HCL 4 MG PO TABS: 4.0000 mg | ORAL_TABLET | Freq: Four times a day (QID) | ORAL | Status: DC | PRN

## 2024-02-16 NOTE — ED Provider Notes (Signed)
 Care transferred to me.  Patient's CTA shows bilateral PEs with right heart strain.  I discussed this with the radiologist.  I have also viewed/interpreted these images and agree with radiology.  Discussed with patient, will put on IV heparin  and send troponin and BNP.  She is mildly tachycardic at around to 100 bpm, otherwise clinically looks well.  Discussed with Dr. Celinda, who will admit.  CRITICAL CARE Performed by: Glendia ONEIDA Breeding   Total critical care time: 30 minutes  Critical care time was exclusive of separately billable procedures and treating other patients.  Critical care was necessary to treat or prevent imminent or life-threatening deterioration.  Critical care was time spent personally by me on the following activities: development of treatment plan with patient and/or surrogate as well as nursing, discussions with consultants, evaluation of patient's response to treatment, examination of patient, obtaining history from patient or surrogate, ordering and performing treatments and interventions, ordering and review of laboratory studies, ordering and review of radiographic studies, pulse oximetry and re-evaluation of patient's condition.    Breeding Glendia, MD 02/16/24 [phone]

## 2024-02-16 NOTE — Progress Notes (Signed)
 PHARMACY - ANTICOAGULATION CONSULT NOTE  Pharmacy Consult for IV heparin  Indication: pulmonary embolus  No Known Allergies  Patient Measurements: Height: 5' 2 (157.5 cm) IBW/kg (Calculated) : 50.1  Vital Signs: Temp: 98.8 F (37.1 C) (09/07 1335) Temp Source: Oral (09/07 1335) BP: 127/70 (09/07 1335) Pulse Rate: 94 (09/07 1335)  Labs: Recent Labs    02/16/24 0928  HGB 11.5*  HCT 38.4  PLT 293  CREATININE 0.95    CrCl cannot be calculated (Unknown ideal weight.).   Medical History: Past Medical History:  Diagnosis Date   Arthritis    Hypertension    Pre-diabetes     Medications:  Scheduled:  Infusions:  PRN:   Assessment: Patient presented with abdominal pain. Per CT found to have bilateral PE with right heart strain to start IV heparin . Baseline labs already done. Patient not on any anticoagulation prior to admission.    Goal of Therapy:  Heparin  level 0.3-0.7 units/ml Monitor platelets by anticoagulation protocol: Yes   Plan:  IV heparin  bolus of 2000 units then IV heparin  rate of 1100 units/hr Check heparin  level in 6 hours Daily CBC  Britta Eva Na 02/16/2024,4:22 PM

## 2024-02-16 NOTE — ED Triage Notes (Signed)
 Pt BIBA from home for abdominal pain. Per EMS pain started last evening midline, around umbilicus.  Pt denies nausea, vomiting or diarrhea.  Last normal BM was yesterday.    Pt had knee surgery approximately 1 month ago, right knee, slightly warm.  18 g in left ac 25 mcg of fentanyl  given  V/S within range .

## 2024-02-16 NOTE — Progress Notes (Signed)
 PHARMACY - ANTICOAGULATION CONSULT NOTE  Pharmacy Consult for IV heparin  Indication: pulmonary embolus  No Known Allergies  Patient Measurements: Height: 5' 2 (157.5 cm) Weight: 86.2 kg (190 lb) IBW/kg (Calculated) : 50.1  Vital Signs: Temp: 100.8 F (38.2 C) (09/07 2345) Temp Source: Oral (09/07 2345) BP: 116/67 (09/07 2345) Pulse Rate: 93 (09/07 2345)  Labs: Recent Labs    02/16/24 0928 02/16/24 2253  HGB 11.5*  --   HCT 38.4  --   PLT 293  --   HEPARINUNFRC  --  0.18*  CREATININE 0.95  --     Estimated Creatinine Clearance: 56.9 mL/min (by C-G formula based on SCr of 0.95 mg/dL).   Medical History: Past Medical History:  Diagnosis Date   Arthritis    Hypertension    Pre-diabetes     Medications:  Scheduled:  Infusions:  PRN:   Assessment: Patient presented with abdominal pain. Per CT found to have bilateral PE with right heart strain to start IV heparin . Baseline labs already done. Patient not on any anticoagulation prior to admission.   HL 0.18 subtherapeutic on 1100 units/hr No bleeding noted  Goal of Therapy:  Heparin  level 0.3-0.7 units/ml Monitor platelets by anticoagulation protocol: Yes   Plan:  IV heparin  bolus of 2000 units then IV heparin  rate of 1350 Check heparin  level in 6 hours Daily CBC  Leeroy Mace RPh 02/16/2024, 11:52 PM

## 2024-02-16 NOTE — H&P (Signed)
 History and Physical    Patient: Laurie Cooke FMW:[id number] DOB: Feb 07, 1956 DOA: 02/16/2024 DOS: the patient was seen and examined on 02/16/2024 PCP: Sun, Vyvyan, MD  Patient coming from: Home  Chief Complaint:  Chief Complaint  Patient presents with   Abdominal Pain   HPI: Laurie Cooke is a 68 y.o. female with medical history significant of osteoarthritis, hypertension, prediabetes who presented initially with upper abdominal pain without nausea, vomiting or diarrhea.  She is a status post right TKA 1 month ago.  No travel history.  No family or personal history of thromboembolism.  Positive pleuritic chest pain, but no palpitations, diaphoresis, PND, orthopnea or significant pitting edema of the lower extremities.  She denied fever, chills, rhinorrhea, sore throat, wheezing or hemoptysis. .  No abdominal pain, nausea, emesis, diarrhea, constipation, melena or hematochezia.  No flank pain, dysuria, frequency or hematuria.  No polyuria, polydipsia, polyphagia or blurred vision.   ED course: Initial vital signs were temperature 99.1 F, pulse 92, respirations 17, BP 125/67 mmHg and O2 sat 98% on room air.  The patient received fentanyl  50 mcg IVP, ondansetron  4 mg IVP, normal saline 500 mL bolus and was started on heparin  infusion.  Lab work: Urinalysis shows small hemoglobin, moderate leukocyte esterase, 11-20 WBC and rare bacteria on microscopic examination.  CBC showed a white count 9.1, hemoglobin 11.5 g/dL platelets 706.  Lipase was 17 units/L, magnesium  1.7 and phosphorus 2.2 mg/dL.  CMP was unremarkable with the exception of a glucose of 153 mg/dL.  Imaging: 2 view chest radiograph showed low lung volumes with mild bibasilar atelectasis.  CT abdomen/pelvis with contrast was suspicious for pulmonary emboli.  There is bilateral lower lobe subsegmental atelectasis.  CTA chest showed bilateral pulmonary emboli with RV to LV radio 1.0 consistent with submassive pulmonary embolism.   Review of  Systems: As mentioned in the history of present illness. All other systems reviewed and are negative. Past Medical History:  Diagnosis Date   Arthritis    Hypertension    Pre-diabetes    Past Surgical History:  Procedure Laterality Date   HIP ARTHROPLASTY Left    KNEE ARTHROPLASTY Left 02/22/2022   Procedure: COMPUTER ASSISTED TOTAL KNEE ARTHROPLASTY;  Surgeon: Fidel Rogue, MD;  Location: [facility];  Service: Orthopedics;  Laterality: Left;  150   Social History:  reports that she has never smoked. She has never used smokeless tobacco. She reports that she does not drink alcohol  and does not use drugs.  No Known Allergies  Family History  Problem Relation Age of Onset   Hypertension Mother    Hypertension Father    Diabetes Father     Prior to Admission medications   Medication Sig Start Date End Date Taking? Authorizing Provider  acetaminophen  (TYLENOL ) 500 MG tablet Take 1,000 mg by mouth every 6 (six) hours as needed for mild pain.    [provider]  latanoprost  (XALATAN ) 0.005 % ophthalmic solution Place 1 drop into both eyes at bedtime. 11/30/21   [provider]  meloxicam  (MOBIC ) 15 MG tablet Take 15 mg by mouth daily. 02/02/22   [provider]  methocarbamol  (ROBAXIN ) 500 MG tablet Take 1 tablet (500 mg total) by mouth every 6 (six) hours as needed for muscle spasms. 02/22/22   Hill, Valery RAMAN, PA-C  timolol  (TIMOPTIC ) 0.5 % ophthalmic solution Place 1 drop into both eyes 2 (two) times daily. 01/06/22   [provider]  valsartan-hydrochlorothiazide (DIOVAN-HCT) 160-25 MG per tablet Take 1 tablet  by mouth daily.    [provider]    Physical Exam: Vitals:   02/16/24 0932 02/16/24 0937 02/16/24 1040 02/16/24 1335  BP: 125/67 125/67  127/70  Pulse: (!) 102 (!) 102  94  Resp: 17 17  18   Temp: 99.1 F (37.3 C) 99.1 F (37.3 C)  98.8 F (37.1 C)  TempSrc: Oral   Oral  SpO2: 98% 98% 98% 98%  Height:  5' 2 (1.575 m)      Physical Exam Vitals reviewed.  Constitutional:      General: She is awake. She is not in acute distress.    Appearance: She is obese. She is ill-appearing.  HENT:     Head: Normocephalic.     Nose: No rhinorrhea.     Mouth/Throat:     Mouth: Mucous membranes are moist.  Eyes:     General: No scleral icterus.    Pupils: Pupils are equal, round, and reactive to light.  Neck:     Vascular: No JVD.  Cardiovascular:     Rate and Rhythm: Normal rate and regular rhythm.     Heart sounds: S1 normal and S2 normal.  Pulmonary:     Effort: Pulmonary effort is normal.     Breath sounds: Normal breath sounds. No wheezing, rhonchi or rales.  Abdominal:     General: Bowel sounds are normal. There is no distension.     Palpations: Abdomen is soft.     Tenderness: There is no abdominal tenderness. There is no right CVA tenderness or left CVA tenderness.  Musculoskeletal:     Cervical back: Neck supple.     Right lower leg: No edema.     Left lower leg: No edema.  Skin:    General: Skin is warm and dry.  Neurological:     General: No focal deficit present.     Mental Status: She is alert and oriented to person, place, and time.  Psychiatric:        Mood and Affect: Mood normal.        Behavior: Behavior normal. Behavior is cooperative.     Data Reviewed:  Results are pending, will review when available.  Assessment and Plan: Principal Problem:   Pulmonary embolism (HCC) Observation/SDU. Supplemental oxygen as needed. Continue heparin  infusion. Oxycodone  as needed for pain. Trend troponin level. Obtain echocardiogram. Obtain bilateral lower extremity Doppler. Explained DOAC need for 6 months. Advised to monitor for melena, bruising, lightheadedness.  Active Problems:   Benign essential hypertension Hold valsartan/HCTZ for now.    Gastroesophageal reflux disease Antiacid, H2 blocker or PPI as needed.    Iron deficiency anemia Continue iron supplementation. Follow-up  hematocrit and hemoglobin.    Prediabetes Monitor fasting glucose.    Primary open-angle glaucoma, left eye, mild stage Continue timolol  drops. Follow-up with ophthalmology as an outpatient.    Hypophosphatemia Replacing. Follow level as needed. Also optimize potassium and magnesium .   Advance Care Planning:   Code Status: Full Code   Consults:   Family Communication: Her husband was at bedside.  Severity of Illness: The appropriate patient status for this patient is OBSERVATION. Observation status is judged to be reasonable and necessary in order to provide the required intensity of service to ensure the patient's safety. The patient's presenting symptoms, physical exam findings, and initial radiographic and laboratory data in the context of their medical condition is felt to place them at decreased risk for further clinical deterioration. Furthermore, it is anticipated that the patient will  be medically stable for discharge from the hospital within 2 midnights of admission.   Author: Alm Dorn Castor, MD 02/16/2024 4:20 PM  For on call review [email or web address].   This document was prepared using Dragon voice recognition software and may contain some unintended transcription errors.

## 2024-02-16 NOTE — ED Provider Notes (Signed)
 [place] [facility] AT [facility] Provider Note   CSN: [id number] Arrival date & time: 02/16/24  9078     Patient presents with: Abdominal Pain   Laurie Cooke is a 69 y.o. female.    Abdominal Pain Patient with abdominal pain.  Decreased appetite.  Somewhat diffuse abdominal pain.  No nausea or vomiting.  No fevers.  Reportedly is been losing weight.  Has been feeling bad the last few days but worse yesterday.  States pain will go through to the back.  Not associated with food.  States has had no appetite  Had relatively recent right knee surgery.  States knee is feeling better.    Past Medical History:  Diagnosis Date   Arthritis    Hypertension    Pre-diabetes     Prior to Admission medications   Medication Sig Start Date End Date Taking? Authorizing Provider  acetaminophen  (TYLENOL ) 500 MG tablet Take 1,000 mg by mouth every 6 (six) hours as needed for mild pain.    [provider]  latanoprost  (XALATAN ) 0.005 % ophthalmic solution Place 1 drop into both eyes at bedtime. 11/30/21   [provider]  meloxicam  (MOBIC ) 15 MG tablet Take 15 mg by mouth daily. 02/02/22   [provider]  methocarbamol  (ROBAXIN ) 500 MG tablet Take 1 tablet (500 mg total) by mouth every 6 (six) hours as needed for muscle spasms. 02/22/22   Hill, Valery RAMAN, PA-C  timolol  (TIMOPTIC ) 0.5 % ophthalmic solution Place 1 drop into both eyes 2 (two) times daily. 01/06/22   [provider]  valsartan-hydrochlorothiazide (DIOVAN-HCT) 160-25 MG per tablet Take 1 tablet by mouth daily.    [provider]    Allergies: Patient has no known allergies.    Review of Systems  Gastrointestinal:  Positive for abdominal pain.    Updated Vital Signs BP 127/70 (BP Location: Right Arm)   Pulse 94   Temp 98.8 F (37.1 C) (Oral)   Resp 18   Ht 5' 2 (1.575 m)   SpO2 98%   BMI 33.03 kg/m   Physical Exam Vitals and nursing note reviewed.   Cardiovascular:     Rate and Rhythm: Normal rate.  Abdominal:     Tenderness: There is abdominal tenderness.     Hernia: No hernia is present.     Comments: Mild diffuse tenderness without rebound or guarding.  No hernia palpated.  Neurological:     Mental Status: She is alert.     (all labs ordered are listed, but only abnormal results are displayed) Labs Reviewed  COMPREHENSIVE METABOLIC PANEL WITH GFR - Abnormal; Notable for the following components:      Result Value   Glucose, Bld 153 (*)    All other components within normal limits  CBC - Abnormal; Notable for the following components:   Hemoglobin 11.5 (*)    MCH 25.2 (*)    MCHC 29.9 (*)    All other components within normal limits  URINALYSIS, ROUTINE W REFLEX MICROSCOPIC - Abnormal; Notable for the following components:   APPearance HAZY (*)    Hgb urine dipstick SMALL (*)    Leukocytes,Ua MODERATE (*)    Bacteria, UA RARE (*)    All other components within normal limits  LIPASE, BLOOD    EKG: None  Radiology: CT ABDOMEN PELVIS W CONTRAST Result Date: 02/16/2024 CLINICAL DATA:  Periumbilical pain EXAM: CT ABDOMEN AND PELVIS WITH CONTRAST TECHNIQUE: Multidetector CT imaging of the abdomen and pelvis  was performed using the standard protocol following bolus administration of intravenous contrast. RADIATION DOSE REDUCTION: This exam was performed according to the departmental dose-optimization program which includes automated exposure control, adjustment of the mA and/or kV according to patient size and/or use of iterative reconstruction technique. CONTRAST:  OMNIPAQUE  IOHEXOL  300 MG/ML  SOLN COMPARISON:  Abdomen and pelvis dated 01/20/2006 FINDINGS: Lower chest: Bilateral lower lobe subsegmental atelectasis interspersed with heterogeneous ground-glass opacities. No pleural effusion or pneumothorax demonstrated. Partially imaged heart size is normal. Apparent filling defect within partially imaged left lower lobe  pulmonary arteries (2:1) and right interlobar artery (2:2). Hepatobiliary: Subcentimeter hypodensity in segment 2 (2:24), not substantially changed from 2007, likely benign cyst. No intra or extrahepatic biliary ductal dilation. Normal gallbladder. Pancreas: No focal lesions or main ductal dilation. Spleen: Normal in size without focal abnormality. Adrenals/Urinary Tract: No adrenal nodules. No suspicious renal mass, calculi or hydronephrosis. Underdistended urinary bladder. Stomach/Bowel: Small hiatal hernia. Normal appearance of the stomach. No evidence of bowel wall thickening, distention, or inflammatory changes. Appendix is not discretely seen. Vascular/Lymphatic: Aortic atherosclerosis. Left external iliac lymph nodes measuring up to 10 mm (2:72). Reproductive: No adnexal masses. Other: No free fluid, fluid collection, or free air. Musculoskeletal: Status post left hip arthroplasty. Hardware appears intact. Multilevel degenerative changes of the partially imaged thoracic and lumbar spine. Grade 1 anterolisthesis at L4-5. Mild compression deformity of L3 and L4 superior endplate, age indeterminate. IMPRESSION: 1. Apparent filling defect within partially imaged left lower lobe pulmonary arteries and right interlobar artery, suspicious for pulmonary emboli. 2. Bilateral lower lobe subsegmental atelectasis interspersed with heterogeneous ground-glass opacities, which may represent atelectasis or pulmonary infarction in the setting of suspected pulmonary emboli. 3. Left external iliac lymph nodes measuring up to 10 mm, nonspecific, may be reactive to left hip arthroplasty. 4.  Aortic Atherosclerosis (ICD10-I70.0). Critical Value/emergent results were called by telephone at the time of interpretation on 02/16/2024 at 11:54 am to provider [facility] , who verbally acknowledged these results. Electronically Signed   By: Limin  Xu M.D.   On: 02/16/2024 11:57   DG Chest 2 View Result Date: 02/16/2024 CLINICAL  DATA:  Abdominal pain. EXAM: CHEST - 2 VIEW COMPARISON:  None Available. FINDINGS: The heart size and mediastinal contours are within normal limits. Low lung volumes are noted. Mild atelectatic changes are seen within the bilateral lung bases. No pleural effusion or pneumothorax is identified. The visualized skeletal structures are unremarkable. IMPRESSION: Low lung volumes with mild bibasilar atelectasis. Electronically Signed   By: Suzen Dials M.D.   On: 02/16/2024 10:26     Procedures   Medications Ordered in the ED  iohexol  (OMNIPAQUE ) 350 MG/ML injection 80 mL (has no administration in time range)  ondansetron  (ZOFRAN ) injection 4 mg (4 mg Intravenous Given 02/16/24 1015)  sodium chloride  0.9 % bolus 500 mL (500 mLs Intravenous New Bag/Given 02/16/24 1015)  iohexol  (OMNIPAQUE ) 300 MG/ML solution 100 mL (100 mLs Intravenous Contrast Given 02/16/24 1127)  fentaNYL  (SUBLIMAZE ) injection 50 mcg (50 mcg Intravenous Given 02/16/24 1213)                                    Medical Decision Making Amount and/or Complexity of Data Reviewed Labs: ordered. Radiology: ordered.  Risk Prescription drug management.   Patient abdominal pain.  Decreased appetite.  Differential diagnoses longed but include cause of bowel obstruction, colitis.  Has had some weight loss.  Will get basic blood work.  Chest x-ray done due to previous difficulty swallowing with food getting caught in her chest.  Will get CT scan to further evaluate.  CT scan abdomen pelvis showed potentially pulmonary embolisms.  Although abdomen pelvis part of it was overall reassuring except for some lymph nodes.  Will get CT scan of the chest to further evaluate for pulmonary embolism.  Care turned over to oncoming provider.     Final diagnoses:  Acute pulmonary embolism without acute cor pulmonale, unspecified pulmonary embolism type [facility])    ED Discharge Orders     None          Patsey Lot, MD 02/16/24  1445

## 2024-02-17 ENCOUNTER — Telehealth (HOSPITAL_COMMUNITY): Payer: Self-pay | Admitting: Pharmacy Technician

## 2024-02-17 ENCOUNTER — Observation Stay (HOSPITAL_COMMUNITY)

## 2024-02-17 ENCOUNTER — Other Ambulatory Visit (HOSPITAL_COMMUNITY): Payer: Self-pay

## 2024-02-17 DIAGNOSIS — K219 Gastro-esophageal reflux disease without esophagitis: Secondary | ICD-10-CM | POA: Diagnosis present

## 2024-02-17 DIAGNOSIS — H401121 Primary open-angle glaucoma, left eye, mild stage: Secondary | ICD-10-CM | POA: Diagnosis present

## 2024-02-17 DIAGNOSIS — I824Z1 Acute embolism and thrombosis of unspecified deep veins of right distal lower extremity: Secondary | ICD-10-CM | POA: Diagnosis not present

## 2024-02-17 DIAGNOSIS — Z6834 Body mass index (BMI) 34.0-34.9, adult: Secondary | ICD-10-CM | POA: Diagnosis not present

## 2024-02-17 DIAGNOSIS — J9811 Atelectasis: Secondary | ICD-10-CM | POA: Diagnosis present

## 2024-02-17 DIAGNOSIS — D509 Iron deficiency anemia, unspecified: Secondary | ICD-10-CM | POA: Diagnosis present

## 2024-02-17 DIAGNOSIS — Z791 Long term (current) use of non-steroidal anti-inflammatories (NSAID): Secondary | ICD-10-CM | POA: Diagnosis not present

## 2024-02-17 DIAGNOSIS — E66811 Obesity, class 1: Secondary | ICD-10-CM | POA: Diagnosis present

## 2024-02-17 DIAGNOSIS — R000 Tachycardia, unspecified: Secondary | ICD-10-CM | POA: Diagnosis present

## 2024-02-17 DIAGNOSIS — I2699 Other pulmonary embolism without acute cor pulmonale: Secondary | ICD-10-CM | POA: Diagnosis present

## 2024-02-17 DIAGNOSIS — Z79899 Other long term (current) drug therapy: Secondary | ICD-10-CM | POA: Diagnosis not present

## 2024-02-17 DIAGNOSIS — Z86711 Personal history of pulmonary embolism: Secondary | ICD-10-CM

## 2024-02-17 DIAGNOSIS — I10 Essential (primary) hypertension: Secondary | ICD-10-CM | POA: Diagnosis present

## 2024-02-17 DIAGNOSIS — Z96653 Presence of artificial knee joint, bilateral: Secondary | ICD-10-CM | POA: Diagnosis present

## 2024-02-17 DIAGNOSIS — I2609 Other pulmonary embolism with acute cor pulmonale: Secondary | ICD-10-CM

## 2024-02-17 DIAGNOSIS — I82441 Acute embolism and thrombosis of right tibial vein: Secondary | ICD-10-CM | POA: Diagnosis present

## 2024-02-17 DIAGNOSIS — Z833 Family history of diabetes mellitus: Secondary | ICD-10-CM | POA: Diagnosis not present

## 2024-02-17 DIAGNOSIS — Z96642 Presence of left artificial hip joint: Secondary | ICD-10-CM | POA: Diagnosis present

## 2024-02-17 DIAGNOSIS — R7303 Prediabetes: Secondary | ICD-10-CM | POA: Diagnosis present

## 2024-02-17 DIAGNOSIS — Z8249 Family history of ischemic heart disease and other diseases of the circulatory system: Secondary | ICD-10-CM | POA: Diagnosis not present

## 2024-02-17 LAB — HIV ANTIBODY (ROUTINE TESTING W REFLEX): HIV Screen 4th Generation wRfx: NONREACTIVE

## 2024-02-17 LAB — COMPREHENSIVE METABOLIC PANEL WITH GFR
ALT: 20 U/L (ref 0–44)
AST: 15 U/L (ref 15–41)
Albumin: 3.3 g/dL — ABNORMAL LOW (ref 3.5–5.0)
Alkaline Phosphatase: 92 U/L (ref 38–126)
Anion gap: 11 (ref 5–15)
BUN: 11 mg/dL (ref 8–23)
CO2: 25 mmol/L (ref 22–32)
Calcium: 8.9 mg/dL (ref 8.9–10.3)
Chloride: 103 mmol/L (ref 98–111)
Creatinine, Ser: 0.9 mg/dL (ref 0.44–1.00)
GFR, Estimated: >60 mL/min (ref >60)
Glucose, Bld: 136 mg/dL — ABNORMAL HIGH (ref 70–99)
Potassium: 4.1 mmol/L (ref 3.5–5.1)
Sodium: 140 mmol/L (ref 135–145)
Total Bilirubin: 0.9 mg/dL (ref 0.0–1.2)
Total Protein: 6.5 g/dL (ref 6.5–8.1)

## 2024-02-17 LAB — CBC
HCT: 31.6 % — ABNORMAL LOW (ref 36.0–46.0)
Hemoglobin: 9.5 g/dL — ABNORMAL LOW (ref 12.0–15.0)
MCH: 25.5 pg — ABNORMAL LOW (ref 26.0–34.0)
MCHC: 30.1 g/dL (ref 30.0–36.0)
MCV: 84.9 fL (ref 80.0–100.0)
Platelets: 227 K/uL (ref 150–400)
RBC: 3.72 MIL/uL — ABNORMAL LOW (ref 3.87–5.11)
RDW: 15.2 % (ref 11.5–15.5)
WBC: 9.2 K/uL (ref 4.0–10.5)
nRBC: 0 % (ref 0.0–0.2)

## 2024-02-17 LAB — ECHOCARDIOGRAM COMPLETE
Area-P 1/2: 3.74 cm2
Height: 62 in
S' Lateral: 2.5 cm
Weight: 3040 [oz_av]

## 2024-02-17 LAB — HEPARIN LEVEL (UNFRACTIONATED)
Heparin Unfractionated: 0.65 [IU]/mL (ref 0.30–0.70)
Heparin Unfractionated: 0.43 [IU]/mL (ref 0.30–0.70)

## 2024-02-17 LAB — PROCALCITONIN: Procalcitonin: <0.1 ng/mL

## 2024-02-17 MED ORDER — PANTOPRAZOLE SODIUM 40 MG PO TBEC
40.0000 mg | DELAYED_RELEASE_TABLET | Freq: Every day | ORAL | Status: DC
  Administered 2024-02-17 – 2024-02-20 (×4): 40 mg via ORAL
  Filled 2024-02-17 (×4): qty 1

## 2024-02-17 MED ORDER — SODIUM CHLORIDE 0.9 % IV SOLN
2.0000 g | INTRAVENOUS | Status: DC
  Administered 2024-02-17 – 2024-02-19 (×3): 2 g via INTRAVENOUS
  Filled 2024-02-17 (×4): qty 20

## 2024-02-17 NOTE — Telephone Encounter (Signed)
 Patient Product/process development scientist completed.    The patient is insured through [employer] [facility]. Patient has [employer], may use a copay card, and/or apply for patient assistance if available.    Ran test claim for Eliquis  5 mg and the current 30 day co-pay is $5.00.  Ran test claim for Xarelto 20 mg and the current 30 day co-pay is $5.00.  This test claim was processed through [facility]- copay amounts may vary at other pharmacies due to pharmacy/plan contracts, or as the patient moves through the different stages of their insurance plan.     Reyes Sharps, CPHT Pharmacy Technician III Certified Patient Advocate [facility] Patient Advocate Team Direct Number: [phone]  Fax: [phone]

## 2024-02-17 NOTE — Progress Notes (Signed)
 PHARMACY - ANTICOAGULATION CONSULT NOTE  Pharmacy Consult for IV heparin  Indication: pulmonary embolus  No Known Allergies  Patient Measurements: Height: 5' 2 (157.5 cm) Weight: 86.2 kg (190 lb) IBW/kg (Calculated) : 50.1  Vital Signs: Temp: 98.7 F (37.1 C) (09/08 0400) Temp Source: Oral (09/08 0400) BP: 122/73 (09/08 0400) Pulse Rate: 93 (09/08 0400)  Labs: Recent Labs    02/16/24 0928 02/16/24 2253 02/17/24 0440 02/17/24 0724  HGB 11.5*  --  9.5*  --   HCT 38.4  --  31.6*  --   PLT 293  --  227  --   HEPARINUNFRC  --  0.18*  --  0.65  CREATININE 0.95  --  0.90  --     Estimated Creatinine Clearance: 60.1 mL/min (by C-G formula based on SCr of 0.9 mg/dL).   Medical History: Past Medical History:  Diagnosis Date   Arthritis    Hypertension    Pre-diabetes     Assessment: Patient presented with abdominal pain. Per CT found to have bilateral PE with right heart strain to start IV heparin . Baseline labs already done. Patient not on any anticoagulation prior to admission.   Today, 02/17/24: HL 0.65--therapeutic on heparin  1350 units/hr Hgb 11.5>>9.5 from yesterday--low, stable Plts 293>>227 from yesterday, stable No s/sx of bleeding, per RN  Goal of Therapy:  Heparin  level 0.3-0.7 units/ml Monitor platelets by anticoagulation protocol: Yes   Plan:  Continue IV heparin  rate of 1350 Check confirmatory heparin  level in 6 hours Daily CBC, heparin  level Continue to monitor for s/sx of bleeding  DOAC price checks pending   Lacinda Moats, PharmD Clinical Pharmacist  9/8/20258:04 AM

## 2024-02-17 NOTE — Progress Notes (Addendum)
 PHARMACY - ANTICOAGULATION CONSULT NOTE  Pharmacy Consult for IV heparin  Indication: pulmonary embolus and DVT  No Known Allergies  Patient Measurements: Height: 5' 2 (157.5 cm) Weight: 86.2 kg (190 lb) IBW/kg (Calculated) : 50.1  Vital Signs: Temp: 98.1 F (36.7 C) (09/08 0814) Temp Source: Oral (09/08 0814) BP: 116/68 (09/08 0814) Pulse Rate: 79 (09/08 0814)  Labs: Recent Labs    02/16/24 0928 02/16/24 2253 02/17/24 0440 02/17/24 0724 02/17/24 1415  HGB 11.5*  --  9.5*  --   --   HCT 38.4  --  31.6*  --   --   PLT 293  --  227  --   --   HEPARINUNFRC  --  0.18*  --  0.65 0.43  CREATININE 0.95  --  0.90  --   --     Estimated Creatinine Clearance: 60.1 mL/min (by C-G formula based on SCr of 0.9 mg/dL).   Medical History: Past Medical History:  Diagnosis Date   Arthritis    Hypertension    Pre-diabetes     Assessment: Patient presented with abdominal pain. Per CT found to have bilateral PE with right heart strain to start IV heparin . Baseline labs already done. Patient not on any anticoagulation prior to admission.   Today, 02/17/24: HL 0.43--therapeutic on heparin  1350 units/hr Hgb 11.5>>9.5 from yesterday--low, stable Plts 293>>227 from yesterday, stable No s/sx of bleeding noted Doppler positive for acute right DVT  Goal of Therapy:  Heparin  level 0.3-0.7 units/ml Monitor platelets by anticoagulation protocol: Yes   Plan:  Continue IV heparin  rate of 1350 units/hr Daily CBC, heparin  level Continue to monitor for s/sx of bleeding  F/u long-term anticoag plan DOAC price checks: Eliquis  and Xarelto both $5 with patient's insurance   Lacinda Moats, PharmD Clinical Pharmacist  9/8/20252:34 PM

## 2024-02-17 NOTE — Progress Notes (Signed)
  Echocardiogram 2D Echocardiogram has been performed.  Koleen KANDICE Popper, RDCS 02/17/2024, 8:54 AM

## 2024-02-17 NOTE — Progress Notes (Signed)
 PROGRESS NOTE    Laurie Cooke  FMW:[id number] DOB: 01/19/1956 DOA: 02/16/2024 PCP: Sun, Vyvyan, MD   Brief Narrative: 68 year old with past medical history significant for osteoarthritis, hypertension, prediabetes who presents with upper abdominal pain, nausea vomiting and diarrhea.  History of right TKA 1 month ago.  Also report pleuritic chest pain.  Evaluation in the ED temperature 99 received IV fluids UA showed 11-20 white blood cell.  Chest x-ray showed low lung volume with mild basilar atelectasis.  CT abdomen and pelvis with contrast was suspicious for PE.  Bilateral lower lobe subsegmental atelectasis.  CTA chest showed bilateral PE with RV to LV ratio 1.0 consistent with submassive PE.   Assessment & Plan:   Principal Problem:   Pulmonary embolism (HCC) Active Problems:   Benign essential hypertension   Gastroesophageal reflux disease   Iron deficiency anemia   Prediabetes   Primary open-angle glaucoma, left eye, mild stage   Hypophosphatemia  1-Submassive PE: Presented with pleuritic chest pain proBNP less than 50, troponin less than 15. Continue heparin  drip Doppler: Acute deep vein thrombosis involving the right posterior tibial veins, cystic structure found in the popliteal fossa.  No deep vein thrombosis in the left lower extremity ECHO normal Right side Systolic function.   Fever: Spiking fever of 101 on admission Procalcitonin less than 0.10 UA with 11-20 white blood cells FU urine culture.  Started IV ceftriaxone .   Hypertension - Holding valsartan and hydrochlorothiazide for now.  Blood pressure not elevated.  GERD: Started Protonix .   Prediabetes:  Monitor CBG  Primary open  angle glaucoma left eye mild stage Continue timolol   Hypophosphatemia: Replete orally  Iron deficiency anemia: Continue iron supplement     Estimated body mass index is 34.75 kg/m as calculated from the following:   Height as of this encounter: 5' 2 (1.575 m).    Weight as of this encounter: 86.2 kg.   DVT prophylaxis: Heparin  drip Code Status: Full code Family Communication: Care discussed with patient Disposition Plan:  Status is: Observation The patient remains OBS appropriate and will d/c before 2 midnights.    Consultants:  none  Procedures:  ECHO Doppler  Antimicrobials:    Subjective: He denies any worsening shortness of breath or chest pain.  She has some chronic skin changes and mild redness at the site of her right knee total arthroplasty.  She was prescribed a steroid cream and a short course of prednisone by Dr. Ivonne.  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  She denies dysuria.  She denies cough  Objective: Vitals:   02/16/24 1936 02/16/24 2026 02/16/24 2345 02/17/24 0400  BP:  121/73 116/67 122/73  Pulse:  97 93 93  Resp:  18 19 17   Temp: 100.2 F (37.9 C) 100.2 F (37.9 C) (!) 100.8 F (38.2 C) 98.7 F (37.1 C)  TempSrc: Oral Oral Oral Oral  SpO2:  99% 97% 97%  Weight:      Height:        Intake/Output Summary (Last 24 hours) at 02/17/2024 0754 Last data filed at 02/17/2024 0300 Gross per 24 hour  Intake 654.96 ml  Output --  Net 654.96 ml   Filed Weights   02/16/24 1600  Weight: 86.2 kg    Examination:  General exam: Appears calm and comfortable  Respiratory system: Clear to auscultation. Respiratory effort normal. Cardiovascular system: S1 & S2 heard, RRR. No JVD, murmurs, rubs, gallops or  clicks. No pedal edema. Gastrointestinal system: Abdomen is nondistended, soft and nontender. No organomegaly or masses felt. Normal bowel sounds heard. Central nervous system: Alert and oriented.  Extremities: Symmetric 5 x 5 power.    Data Reviewed: I have personally reviewed following labs and imaging studies  CBC: Recent Labs  Lab 02/16/24 0928 02/17/24 0440  WBC 9.1 9.2  HGB 11.5* 9.5*  HCT 38.4 31.6*  MCV 84.0 84.9  PLT 293 227    Basic Metabolic Panel: Recent Labs  Lab 02/16/24 0928 02/16/24 1629 02/17/24 0440  NA 138  --  140  K 3.6  --  4.1  CL 99  --  103  CO2 27  --  25  GLUCOSE 153*  --  136*  BUN 15  --  11  CREATININE 0.95  --  0.90  CALCIUM 9.8  --  8.9  MG  --  1.7  --   PHOS  --  2.2*  --    GFR: Estimated Creatinine Clearance: 60.1 mL/min (by C-G formula based on SCr of 0.9 mg/dL). Liver Function Tests: Recent Labs  Lab 02/16/24 0928 02/17/24 0440  AST 18 15  ALT 25 20  ALKPHOS 114 92  BILITOT 1.0 0.9  PROT 7.7 6.5  ALBUMIN 3.9 3.3*   Recent Labs  Lab 02/16/24 0928  LIPASE 17   No results for input(s): AMMONIA in the last 168 hours. Coagulation Profile: No results for input(s): INR, PROTIME in the last 168 hours. Cardiac Enzymes: No results for input(s): CKTOTAL, CKMB, CKMBINDEX, TROPONINI in the last 168 hours. BNP (last 3 results) Recent Labs    02/16/24 1627  PROBNP <50.0   HbA1C: No results for input(s): HGBA1C in the last 72 hours. CBG: No results for input(s): GLUCAP in the last 168 hours. Lipid Profile: No results for input(s): CHOL, HDL, LDLCALC, TRIG, CHOLHDL, LDLDIRECT in the last 72 hours. Thyroid Function Tests: No results for input(s): TSH, T4TOTAL, FREET4, T3FREE, THYROIDAB in the last 72 hours. Anemia Panel: No results for input(s): VITAMINB12, FOLATE, FERRITIN, TIBC, IRON, RETICCTPCT in the last 72 hours. Sepsis Labs: Recent Labs  Lab 02/16/24 1844 02/17/24 0440  PROCALCITON <0.10 <0.10    No results found for this or any previous visit (from the past 240 hours).       Radiology Studies: CT Angio Chest PE W and/or Wo Contrast Result Date: 02/16/2024 CLINICAL DATA:  Pulmonary embolism suspected, high probability. EXAM: CT ANGIOGRAPHY CHEST WITH CONTRAST TECHNIQUE: Multidetector CT imaging of the chest was performed using the standard protocol during bolus administration of intravenous  contrast. Multiplanar CT image reconstructions and MIPs were obtained to evaluate the vascular anatomy. RADIATION DOSE REDUCTION: This exam was performed according to the departmental dose-optimization program which includes automated exposure control, adjustment of the mA and/or kV according to patient size and/or use of iterative reconstruction technique. CONTRAST:  80mL OMNIPAQUE  IOHEXOL  350 MG/ML SOLN COMPARISON:  None Available. FINDINGS: Cardiovascular: Filling defects are seen in the pulmonary arteries bilaterally, as proximal as the lobar level. RV/LV ratio 1.0. Atherosclerotic calcification of the aorta, aortic valve and coronary arteries. Heart is enlarged. No pericardial effusion. Mediastinum/Nodes: No pathologically enlarged mediastinal, hilar or axillary lymph nodes. Air in the esophagus can be seen with dysmotility. Lungs/Pleura: Patchy ground-glass and volume loss in both lower lobes. No pleural fluid. Airway is unremarkable. Upper Abdomen: Small left hepatic lobe cyst. No specific follow-up necessary. Visualized portions of the liver, adrenal glands, kidneys, spleen, pancreas, stomach and bowel are otherwise  grossly unremarkable. No upper abdominal adenopathy. Musculoskeletal: Degenerative changes in the spine. Review of the MIP images confirms the above findings. IMPRESSION: 1. Bilateral pulmonary emboli with the most proximal clot seen at the lobar level. Positive for acute PE with CT evidence of right heart strain (RV/LV Ratio = 1.0) consistent with at least submassive (intermediate risk) PE. The presence of right heart strain has been associated with an increased risk of morbidity and mortality. Please refer to the Code PE Focused order set in EPIC. Critical Value/emergent results were called by telephone at the time of interpretation on 02/16/2024 at 3:55 pm to provider DR. GOLDSTEIN, who verbally acknowledged these results. 2. Bilateral lower lobe infarcts and/or atelectasis. 3. Aortic  atherosclerosis (ICD10-I70.0). Coronary artery calcification. Electronically Signed   By: Newell Eke M.D.   On: 02/16/2024 15:55   CT ABDOMEN PELVIS W CONTRAST Result Date: 02/16/2024 CLINICAL DATA:  Periumbilical pain EXAM: CT ABDOMEN AND PELVIS WITH CONTRAST TECHNIQUE: Multidetector CT imaging of the abdomen and pelvis was performed using the standard protocol following bolus administration of intravenous contrast. RADIATION DOSE REDUCTION: This exam was performed according to the departmental dose-optimization program which includes automated exposure control, adjustment of the mA and/or kV according to patient size and/or use of iterative reconstruction technique. CONTRAST:  OMNIPAQUE  IOHEXOL  300 MG/ML  SOLN COMPARISON:  Abdomen and pelvis dated 01/20/2006 FINDINGS: Lower chest: Bilateral lower lobe subsegmental atelectasis interspersed with heterogeneous ground-glass opacities. No pleural effusion or pneumothorax demonstrated. Partially imaged heart size is normal. Apparent filling defect within partially imaged left lower lobe pulmonary arteries (2:1) and right interlobar artery (2:2). Hepatobiliary: Subcentimeter hypodensity in segment 2 (2:24), not substantially changed from 2007, likely benign cyst. No intra or extrahepatic biliary ductal dilation. Normal gallbladder. Pancreas: No focal lesions or main ductal dilation. Spleen: Normal in size without focal abnormality. Adrenals/Urinary Tract: No adrenal nodules. No suspicious renal mass, calculi or hydronephrosis. Underdistended urinary bladder. Stomach/Bowel: Small hiatal hernia. Normal appearance of the stomach. No evidence of bowel wall thickening, distention, or inflammatory changes. Appendix is not discretely seen. Vascular/Lymphatic: Aortic atherosclerosis. Left external iliac lymph nodes measuring up to 10 mm (2:72). Reproductive: No adnexal masses. Other: No free fluid, fluid collection, or free air. Musculoskeletal: Status post left hip  arthroplasty. Hardware appears intact. Multilevel degenerative changes of the partially imaged thoracic and lumbar spine. Grade 1 anterolisthesis at L4-5. Mild compression deformity of L3 and L4 superior endplate, age indeterminate. IMPRESSION: 1. Apparent filling defect within partially imaged left lower lobe pulmonary arteries and right interlobar artery, suspicious for pulmonary emboli. 2. Bilateral lower lobe subsegmental atelectasis interspersed with heterogeneous ground-glass opacities, which may represent atelectasis or pulmonary infarction in the setting of suspected pulmonary emboli. 3. Left external iliac lymph nodes measuring up to 10 mm, nonspecific, may be reactive to left hip arthroplasty. 4.  Aortic Atherosclerosis (ICD10-I70.0). Critical Value/emergent results were called by telephone at the time of interpretation on 02/16/2024 at 11:54 am to provider [facility] , who verbally acknowledged these results. Electronically Signed   By: Limin  Xu M.D.   On: 02/16/2024 11:57   DG Chest 2 View Result Date: 02/16/2024 CLINICAL DATA:  Abdominal pain. EXAM: CHEST - 2 VIEW COMPARISON:  None Available. FINDINGS: The heart size and mediastinal contours are within normal limits. Low lung volumes are noted. Mild atelectatic changes are seen within the bilateral lung bases. No pleural effusion or pneumothorax is identified. The visualized skeletal structures are unremarkable. IMPRESSION: Low lung volumes with mild bibasilar atelectasis.  Electronically Signed   By: Suzen Dials M.D.   On: 02/16/2024 10:26        Scheduled Meds:  feeding supplement  237 mL Oral BID BM   ferrous sulfate   325 mg Oral Q breakfast   latanoprost   1 drop Both Eyes QHS   timolol   1 drop Both Eyes BID   Continuous Infusions:  heparin  1,350 Units/hr (02/16/24 2356)     LOS: 0 days    Time spent: 35 minutes    Lanina Larranaga A Karley Pho, MD [facility]   If 7PM-7AM, please contact  night-coverage [email or web address]  02/17/2024, 7:54 AM

## 2024-02-18 DIAGNOSIS — I2699 Other pulmonary embolism without acute cor pulmonale: Secondary | ICD-10-CM | POA: Diagnosis not present

## 2024-02-18 LAB — URINE CULTURE

## 2024-02-18 LAB — CBC
HCT: 29.9 % — ABNORMAL LOW (ref 36.0–46.0)
Hemoglobin: 9 g/dL — ABNORMAL LOW (ref 12.0–15.0)
MCH: 25.6 pg — ABNORMAL LOW (ref 26.0–34.0)
MCHC: 30.1 g/dL (ref 30.0–36.0)
MCV: 85.2 fL (ref 80.0–100.0)
Platelets: 209 K/uL (ref 150–400)
RBC: 3.51 MIL/uL — ABNORMAL LOW (ref 3.87–5.11)
RDW: 15 % (ref 11.5–15.5)
WBC: 6.1 K/uL (ref 4.0–10.5)
nRBC: 0 % (ref 0.0–0.2)

## 2024-02-18 LAB — BASIC METABOLIC PANEL WITH GFR
Anion gap: 10 (ref 5–15)
BUN: 10 mg/dL (ref 8–23)
CO2: 24 mmol/L (ref 22–32)
Calcium: 9.1 mg/dL (ref 8.9–10.3)
Chloride: 103 mmol/L (ref 98–111)
Creatinine, Ser: 0.79 mg/dL (ref 0.44–1.00)
GFR, Estimated: >60 mL/min (ref >60)
Glucose, Bld: 102 mg/dL — ABNORMAL HIGH (ref 70–99)
Potassium: 3.8 mmol/L (ref 3.5–5.1)
Sodium: 138 mmol/L (ref 135–145)

## 2024-02-18 LAB — PHOSPHORUS: Phosphorus: 3 mg/dL (ref 2.5–4.6)

## 2024-02-18 LAB — HEPARIN LEVEL (UNFRACTIONATED): Heparin Unfractionated: 0.55 [IU]/mL (ref 0.30–0.70)

## 2024-02-18 NOTE — Progress Notes (Signed)
 Mobility Specialist - Progress Note   02/18/24 0900  Mobility  Activity Ambulated with assistance  Level of Assistance Modified independent, requires aide device or extra time  Assistive Device Front wheel walker  Distance Ambulated (ft) 10 ft  Range of Motion/Exercises Active  Activity Response Tolerated well  Mobility visit 1 Mobility  Mobility Specialist Start Time (ACUTE ONLY) 0900  Mobility Specialist Stop Time (ACUTE ONLY) 0913  Mobility Specialist Time Calculation (min) (ACUTE ONLY) 13 min   Pt requested assistance to bathroom. Pt requested time and understood to pull call bell when finished. Pt family in room. RN notified.   Erminio Leos,  Mobility Specialist Can be reached via Secure Chat

## 2024-02-18 NOTE — Progress Notes (Signed)
 PHARMACY - ANTICOAGULATION CONSULT NOTE  Pharmacy Consult for IV heparin  Indication: pulmonary embolus and DVT  No Known Allergies  Patient Measurements: Height: 5' 2 (157.5 cm) Weight: 86.2 kg (190 lb) IBW/kg (Calculated) : 50.1  Vital Signs: Temp: 99.1 F (37.3 C) (09/09 0646) Temp Source: Oral (09/09 0646) BP: 138/75 (09/09 0646) Pulse Rate: 79 (09/09 0646)  Labs: Recent Labs    02/16/24 0928 02/16/24 2253 02/17/24 0440 02/17/24 0724 02/17/24 1415 02/18/24 0458  HGB 11.5*  --  9.5*  --   --  9.0*  HCT 38.4  --  31.6*  --   --  29.9*  PLT 293  --  227  --   --  209  HEPARINUNFRC  --    < >  --  0.65 0.43 0.55  CREATININE 0.95  --  0.90  --   --  0.79   < > = values in this interval not displayed.    Estimated Creatinine Clearance: 67.6 mL/min (by C-G formula based on SCr of 0.79 mg/dL).   Medical History: Past Medical History:  Diagnosis Date   Arthritis    Hypertension    Pre-diabetes     Assessment: Patient presented with abdominal pain. Per CT, found to have bilateral PE with right heart strain. Doppler also positive for acute R DVT. Patient not on any anticoagulation prior to admission. Pharmacy to dose IV heparin .  Today, 02/18/24: HL 0.55--therapeutic on heparin  1350 units/hr Hgb 11.5>>9.5>>9.0 from admission--low, stable Plts 293>>227>>209 from admission--down-trending but stable No s/sx of bleeding reported  Goal of Therapy:  Heparin  level 0.3-0.7 units/ml Monitor platelets by anticoagulation protocol: Yes   Plan:  Continue IV heparin  rate of 1350 units/hr Daily CBC, heparin  level Continue to monitor for s/sx of bleeding  F/u long-term anticoag plan DOAC price checks: Eliquis  and Xarelto both $5 with patient's insurance   Lacinda Moats, PharmD Clinical Pharmacist  9/9/20257:36 AM

## 2024-02-18 NOTE — Progress Notes (Signed)
 PROGRESS NOTE    Laurie Cooke  FMW:[id number] DOB: 12-31-1955 DOA: 02/16/2024 PCP: Sun, Vyvyan, MD   Brief Narrative: 68 year old with past medical history significant for osteoarthritis, hypertension, prediabetes who presents with upper abdominal pain, nausea vomiting and diarrhea.  History of right TKA 1 month ago.  Also report pleuritic chest pain.  Evaluation in the ED temperature 99 received IV fluids UA showed 11-20 white blood cell.  Chest x-ray showed low lung volume with mild basilar atelectasis.  CT abdomen and pelvis with contrast was suspicious for PE.  Bilateral lower lobe subsegmental atelectasis.  CTA chest showed bilateral PE with RV to LV ratio 1.0 consistent with submassive PE.   Assessment & Plan:   Principal Problem:   Pulmonary embolism (HCC) Active Problems:   Benign essential hypertension   Gastroesophageal reflux disease   Iron deficiency anemia   Prediabetes   Primary open-angle glaucoma, left eye, mild stage   Hypophosphatemia  1-Submassive PE: Presented with pleuritic chest pain proBNP less than 50, troponin less than 15. Continue heparin  gtt--plan to transition to Eliquis [place] 02/19/2024. Doppler: Acute deep vein thrombosis involving the right posterior tibial veins, cystic structure found in the popliteal fossa.  No deep vein thrombosis in the left lower extremity ECHO normal Right side Systolic function.   Fever: Spiking fever of 101 on admission Procalcitonin less than 0.10 UA with 11-20 white blood cells FU urine culture. Multiples bacterial morphotype's.  Continue  IV ceftriaxone .   Hypertension - Holding valsartan and hydrochlorothiazide for now.  Blood pressure not elevated.  GERD: Started Protonix .   Prediabetes:  Monitor CBG  Primary open  angle glaucoma left eye mild stage Continue timolol   Hypophosphatemia: Replete orally  Iron deficiency anemia: Continue iron supplement  right TKA 1 month ago Scar , knee no evidence of  infection.   Estimated body mass index is 34.75 kg/m as calculated from the following:   Height as of this encounter: 5' 2 (1.575 m).   Weight as of this encounter: 86.2 kg.   DVT prophylaxis: Heparin  drip Code Status: Full code Family Communication: Care discussed with patient Disposition Plan:  Status is: Observation The patient remains OBS appropriate and will d/c before 2 midnights.    Consultants:  none  Procedures:  ECHO Doppler  Antimicrobials:    Subjective: She denies dyspnea, or chest pain. No cough.    Objective: Vitals:   02/17/24 0400 02/17/24 0814 02/17/24 1941 02/18/24 0646  BP: 122/73 116/68 130/68 138/75  Pulse: 93 79 93 79  Resp: 17  18 18   Temp: 98.7 F (37.1 C) 98.1 F (36.7 C) 99 F (37.2 C) 99.1 F (37.3 C)  TempSrc: Oral Oral Oral Oral  SpO2: 97% 99% 98% 98%  Weight:      Height:        Intake/Output Summary (Last 24 hours) at 02/18/2024 0732 Last data filed at 02/18/2024 0300 Gross per 24 hour  Intake 529.45 ml  Output --  Net 529.45 ml   Filed Weights   02/16/24 1600  Weight: 86.2 kg    Examination:  General exam: NAD Respiratory system: CTA Cardiovascular system: S 1, S 2 RRR Gastrointestinal system: BS present, soft nt Central nervous system: Alert Extremities: Sno edema    Data Reviewed: I have personally reviewed following labs and imaging studies  CBC: Recent Labs  Lab 02/16/24 0928 02/17/24 0440 02/18/24 0458  WBC 9.1 9.2 6.1  HGB 11.5* 9.5* 9.0*  HCT 38.4 31.6* 29.9*  MCV 84.0 84.9 85.2  PLT 293 227 209   Basic Metabolic Panel: Recent Labs  Lab 02/16/24 0928 02/16/24 1629 02/17/24 0440 02/18/24 0458  NA 138  --  140 138  K 3.6  --  4.1 3.8  CL 99  --  103 103  CO2 27  --  25 24  GLUCOSE 153*  --  136* 102*  BUN 15  --  11 10  CREATININE 0.95  --  0.90 0.79  CALCIUM 9.8  --  8.9 9.1  MG  --  1.7  --   --   PHOS  --  2.2*  --  3.0   GFR: Estimated Creatinine Clearance: 67.6 mL/min (by  C-G formula based on SCr of 0.79 mg/dL). Liver Function Tests: Recent Labs  Lab 02/16/24 0928 02/17/24 0440  AST 18 15  ALT 25 20  ALKPHOS 114 92  BILITOT 1.0 0.9  PROT 7.7 6.5  ALBUMIN 3.9 3.3*   Recent Labs  Lab 02/16/24 0928  LIPASE 17   No results for input(s): AMMONIA in the last 168 hours. Coagulation Profile: No results for input(s): INR, PROTIME in the last 168 hours. Cardiac Enzymes: No results for input(s): CKTOTAL, CKMB, CKMBINDEX, TROPONINI in the last 168 hours. BNP (last 3 results) Recent Labs    02/16/24 1627  PROBNP <50.0   HbA1C: No results for input(s): HGBA1C in the last 72 hours. CBG: No results for input(s): GLUCAP in the last 168 hours. Lipid Profile: No results for input(s): CHOL, HDL, LDLCALC, TRIG, CHOLHDL, LDLDIRECT in the last 72 hours. Thyroid Function Tests: No results for input(s): TSH, T4TOTAL, FREET4, T3FREE, THYROIDAB in the last 72 hours. Anemia Panel: No results for input(s): VITAMINB12, FOLATE, FERRITIN, TIBC, IRON, RETICCTPCT in the last 72 hours. Sepsis Labs: Recent Labs  Lab 02/16/24 1844 02/17/24 0440  PROCALCITON <0.10 <0.10    No results found for this or any previous visit (from the past 240 hours).       Radiology Studies: VAS US  LOWER EXTREMITY VENOUS (DVT) Result Date: 02/17/2024  Lower Venous DVT Study Patient Name:  Laurie Cooke  Date of Exam:   02/17/2024 Medical Rec #: [id number]     Accession #:    [id number] Date of Birth: May 04, 1956     Patient Gender: F Patient Age:   17 years Exam Location:  [facility] Procedure:      VAS US  LOWER EXTREMITY VENOUS (DVT) Referring Phys: DAVID ORTIZ --------------------------------------------------------------------------------  Indications: Pulmonary embolism, Post-op, and right total knee arthroplasty.  Comparison Study: Previous study of the left lower extremity on 8.298.2023. Performing Technologist: Edilia Elden Appl  Examination Guidelines: A complete evaluation includes B-mode imaging, spectral Doppler, color Doppler, and power Doppler as needed of all accessible portions of each vessel. Bilateral testing is considered an integral part of a complete examination. Limited examinations for reoccurring indications may be performed as noted. The reflux portion of the exam is performed with the patient in reverse Trendelenburg.  +---------+---------------+---------+-----------+----------+--------------+ RIGHT    CompressibilityPhasicitySpontaneityPropertiesThrombus Aging +---------+---------------+---------+-----------+----------+--------------+ CFV      Full           Yes      Yes                                 +---------+---------------+---------+-----------+----------+--------------+ SFJ      Full           Yes      Yes                                 +---------+---------------+---------+-----------+----------+--------------+  FV Prox  Full                                                        +---------+---------------+---------+-----------+----------+--------------+ FV Mid   Full                                                        +---------+---------------+---------+-----------+----------+--------------+ FV DistalFull                                                        +---------+---------------+---------+-----------+----------+--------------+ PFV      Full                                                        +---------+---------------+---------+-----------+----------+--------------+ POP      Full           Yes      Yes                                 +---------+---------------+---------+-----------+----------+--------------+ PTV      Partial        No       No                                  +---------+---------------+---------+-----------+----------+--------------+ PERO     Full                                                         +---------+---------------+---------+-----------+----------+--------------+ Focal deep vein thrombosis noted in one of the paired right posterior tibial veins at the middle level. Fluid collection noted in the right popliteal fossa measuring 5.3 x 0.9 x 2.2 cm.  +---------+---------------+---------+-----------+----------+--------------+ LEFT     CompressibilityPhasicitySpontaneityPropertiesThrombus Aging +---------+---------------+---------+-----------+----------+--------------+ CFV      Full           Yes      Yes                                 +---------+---------------+---------+-----------+----------+--------------+ SFJ      Full           Yes      Yes                                 +---------+---------------+---------+-----------+----------+--------------+ FV Prox  Full                                                        +---------+---------------+---------+-----------+----------+--------------+  FV Mid   Full                                                        +---------+---------------+---------+-----------+----------+--------------+ FV DistalFull                                                        +---------+---------------+---------+-----------+----------+--------------+ PFV      Full                                                        +---------+---------------+---------+-----------+----------+--------------+ POP      Full           Yes      Yes                                 +---------+---------------+---------+-----------+----------+--------------+ PTV      Full                                                        +---------+---------------+---------+-----------+----------+--------------+ PERO     Full                                                        +---------+---------------+---------+-----------+----------+--------------+     Summary: RIGHT: - Findings consistent with acute deep vein thrombosis involving the  right posterior tibial veins.  - A cystic structure is found in the popliteal fossa.  LEFT: - There is no evidence of deep vein thrombosis in the lower extremity.  - No cystic structure found in the popliteal fossa.  *See table(s) above for measurements and observations. Electronically signed by Gaile New MD on 02/17/2024 at 1:44:19 PM.    Final    ECHOCARDIOGRAM COMPLETE Result Date: 02/17/2024    ECHOCARDIOGRAM REPORT   Patient Name:   Laurie Cooke Date of Exam: 02/17/2024 Medical Rec #:  [id number]    Height:       62.0 in Accession #:    [id number]   Weight:       190.0 lb Date of Birth:  [birth year]    BSA:          1.870 m Patient Age:    68 years     BP:           122/73 mmHg Patient Gender: F            HR:           85 bpm. Exam Location:  Inpatient Procedure: 2D Echo, Cardiac Doppler and Color Doppler (Both Spectral and Color  Flow Doppler were utilized during procedure). Indications:    Pulmonary Embolus I26.09  History:        Patient has no prior history of Echocardiogram examinations.                 Risk Factors:Hypertension and Pre-diabetes.  Sonographer:    Koleen Popper RDCS Referring Phys: [id number] DAVID MANUEL ORTIZ  Sonographer Comments: Image acquisition challenging due to uncooperative patient. IMPRESSIONS  1. Left ventricular ejection fraction, by estimation, is 70 to 75%. The left ventricle has hyperdynamic function. The left ventricle has no regional wall motion abnormalities. There is mild left ventricular hypertrophy. Left ventricular diastolic parameters are consistent with Grade I diastolic dysfunction (impaired relaxation).  2. Right ventricular systolic function is normal. The right ventricular size is normal. There is normal pulmonary artery systolic pressure.  3. The mitral valve is normal in structure. No evidence of mitral valve regurgitation. No evidence of mitral stenosis.  4. The aortic valve is normal in structure. Aortic valve regurgitation is not visualized. No  aortic stenosis is present.  5. The inferior vena cava is normal in size with greater than 50% respiratory variability, suggesting right atrial pressure of 3 mmHg. FINDINGS  Left Ventricle: Left ventricular ejection fraction, by estimation, is 70 to 75%. The left ventricle has hyperdynamic function. The left ventricle has no regional wall motion abnormalities. Strain was performed and the global longitudinal strain is indeterminate. The left ventricular internal cavity size was normal in size. There is mild left ventricular hypertrophy. Left ventricular diastolic parameters are consistent with Grade I diastolic dysfunction (impaired relaxation). Right Ventricle: The right ventricular size is normal. No increase in right ventricular wall thickness. Right ventricular systolic function is normal. There is normal pulmonary artery systolic pressure. The tricuspid regurgitant velocity is 2.57 m/s, and  with an assumed right atrial pressure of 3 mmHg, the estimated right ventricular systolic pressure is 29.4 mmHg. Left Atrium: Left atrial size was normal in size. Right Atrium: Right atrial size was normal in size. Pericardium: There is no evidence of pericardial effusion. Mitral Valve: The mitral valve is normal in structure. No evidence of mitral valve regurgitation. No evidence of mitral valve stenosis. Tricuspid Valve: The tricuspid valve is normal in structure. Tricuspid valve regurgitation is trivial. No evidence of tricuspid stenosis. Aortic Valve: The aortic valve is normal in structure. Aortic valve regurgitation is not visualized. No aortic stenosis is present. Pulmonic Valve: The pulmonic valve was normal in structure. Pulmonic valve regurgitation is not visualized. No evidence of pulmonic stenosis. Aorta: The aortic root is normal in size and structure. Venous: The inferior vena cava is normal in size with greater than 50% respiratory variability, suggesting right atrial pressure of 3 mmHg. IAS/Shunts: The  interatrial septum was not well visualized. Additional Comments: 3D was performed not requiring image post processing on an independent workstation and was indeterminate.  LEFT VENTRICLE PLAX 2D LVIDd:         3.80 cm   Diastology LVIDs:         2.50 cm   LV e' medial:    6.74 cm/s LV PW:         1.00 cm   LV E/e' medial:  10.2 LV IVS:        1.30 cm   LV e' lateral:   10.00 cm/s LVOT diam:     2.10 cm   LV E/e' lateral: 6.9 LV SV:         82 LV SV Index:  44 LVOT Area:     3.46 cm  RIGHT VENTRICLE         IVC TAPSE (M-mode): 2.3 cm  IVC diam: 1.50 cm LEFT ATRIUM           Index LA diam:      3.60 cm 1.92 cm/m LA Vol (A4C): 34.3 ml 18.34 ml/m  AORTIC VALVE LVOT Vmax:   132.00 cm/s LVOT Vmean:  85.100 cm/s LVOT VTI:    0.237 m  AORTA Ao Root diam: 2.70 cm MITRAL VALVE                TRICUSPID VALVE MV Area (PHT): 3.74 cm     TR Peak grad:   26.4 mmHg MV Decel Time: 203 msec     TR Vmax:        257.00 cm/s MV E velocity: 68.60 cm/s MV A velocity: 106.00 cm/s  SHUNTS MV E/A ratio:  0.65         Systemic VTI:  0.24 m                             Systemic Diam: 2.10 cm Maude Emmer MD Electronically signed by Maude Emmer MD Signature Date/Time: 02/17/2024/9:00:07 AM    Final    CT Angio Chest PE W and/or Wo Contrast Result Date: 02/16/2024 CLINICAL DATA:  Pulmonary embolism suspected, high probability. EXAM: CT ANGIOGRAPHY CHEST WITH CONTRAST TECHNIQUE: Multidetector CT imaging of the chest was performed using the standard protocol during bolus administration of intravenous contrast. Multiplanar CT image reconstructions and MIPs were obtained to evaluate the vascular anatomy. RADIATION DOSE REDUCTION: This exam was performed according to the departmental dose-optimization program which includes automated exposure control, adjustment of the mA and/or kV according to patient size and/or use of iterative reconstruction technique. CONTRAST:  80mL OMNIPAQUE  IOHEXOL  350 MG/ML SOLN COMPARISON:  None Available. FINDINGS:  Cardiovascular: Filling defects are seen in the pulmonary arteries bilaterally, as proximal as the lobar level. RV/LV ratio 1.0. Atherosclerotic calcification of the aorta, aortic valve and coronary arteries. Heart is enlarged. No pericardial effusion. Mediastinum/Nodes: No pathologically enlarged mediastinal, hilar or axillary lymph nodes. Air in the esophagus can be seen with dysmotility. Lungs/Pleura: Patchy ground-glass and volume loss in both lower lobes. No pleural fluid. Airway is unremarkable. Upper Abdomen: Small left hepatic lobe cyst. No specific follow-up necessary. Visualized portions of the liver, adrenal glands, kidneys, spleen, pancreas, stomach and bowel are otherwise grossly unremarkable. No upper abdominal adenopathy. Musculoskeletal: Degenerative changes in the spine. Review of the MIP images confirms the above findings. IMPRESSION: 1. Bilateral pulmonary emboli with the most proximal clot seen at the lobar level. Positive for acute PE with CT evidence of right heart strain (RV/LV Ratio = 1.0) consistent with at least submassive (intermediate risk) PE. The presence of right heart strain has been associated with an increased risk of morbidity and mortality. Please refer to the Code PE Focused order set in EPIC. Critical Value/emergent results were called by telephone at the time of interpretation on 02/16/2024 at 3:55 pm to provider DR. GOLDSTEIN, who verbally acknowledged these results. 2. Bilateral lower lobe infarcts and/or atelectasis. 3. Aortic atherosclerosis (ICD10-I70.0). Coronary artery calcification. Electronically Signed   By: Newell Eke M.D.   On: 02/16/2024 15:55   CT ABDOMEN PELVIS W CONTRAST Result Date: 02/16/2024 CLINICAL DATA:  Periumbilical pain EXAM: CT ABDOMEN AND PELVIS WITH CONTRAST TECHNIQUE: Multidetector CT imaging of the abdomen and pelvis was  performed using the standard protocol following bolus administration of intravenous contrast. RADIATION DOSE REDUCTION:  This exam was performed according to the departmental dose-optimization program which includes automated exposure control, adjustment of the mA and/or kV according to patient size and/or use of iterative reconstruction technique. CONTRAST:  OMNIPAQUE  IOHEXOL  300 MG/ML  SOLN COMPARISON:  Abdomen and pelvis dated 01/20/2006 FINDINGS: Lower chest: Bilateral lower lobe subsegmental atelectasis interspersed with heterogeneous ground-glass opacities. No pleural effusion or pneumothorax demonstrated. Partially imaged heart size is normal. Apparent filling defect within partially imaged left lower lobe pulmonary arteries (2:1) and right interlobar artery (2:2). Hepatobiliary: Subcentimeter hypodensity in segment 2 (2:24), not substantially changed from 2007, likely benign cyst. No intra or extrahepatic biliary ductal dilation. Normal gallbladder. Pancreas: No focal lesions or main ductal dilation. Spleen: Normal in size without focal abnormality. Adrenals/Urinary Tract: No adrenal nodules. No suspicious renal mass, calculi or hydronephrosis. Underdistended urinary bladder. Stomach/Bowel: Small hiatal hernia. Normal appearance of the stomach. No evidence of bowel wall thickening, distention, or inflammatory changes. Appendix is not discretely seen. Vascular/Lymphatic: Aortic atherosclerosis. Left external iliac lymph nodes measuring up to 10 mm (2:72). Reproductive: No adnexal masses. Other: No free fluid, fluid collection, or free air. Musculoskeletal: Status post left hip arthroplasty. Hardware appears intact. Multilevel degenerative changes of the partially imaged thoracic and lumbar spine. Grade 1 anterolisthesis at L4-5. Mild compression deformity of L3 and L4 superior endplate, age indeterminate. IMPRESSION: 1. Apparent filling defect within partially imaged left lower lobe pulmonary arteries and right interlobar artery, suspicious for pulmonary emboli. 2. Bilateral lower lobe subsegmental atelectasis  interspersed with heterogeneous ground-glass opacities, which may represent atelectasis or pulmonary infarction in the setting of suspected pulmonary emboli. 3. Left external iliac lymph nodes measuring up to 10 mm, nonspecific, may be reactive to left hip arthroplasty. 4.  Aortic Atherosclerosis (ICD10-I70.0). Critical Value/emergent results were called by telephone at the time of interpretation on 02/16/2024 at 11:54 am to provider [facility] , who verbally acknowledged these results. Electronically Signed   By: Limin  Xu M.D.   On: 02/16/2024 11:57   DG Chest 2 View Result Date: 02/16/2024 CLINICAL DATA:  Abdominal pain. EXAM: CHEST - 2 VIEW COMPARISON:  None Available. FINDINGS: The heart size and mediastinal contours are within normal limits. Low lung volumes are noted. Mild atelectatic changes are seen within the bilateral lung bases. No pleural effusion or pneumothorax is identified. The visualized skeletal structures are unremarkable. IMPRESSION: Low lung volumes with mild bibasilar atelectasis. Electronically Signed   By: Suzen Dials M.D.   On: 02/16/2024 10:26        Scheduled Meds:  feeding supplement  237 mL Oral BID BM   ferrous sulfate   325 mg Oral Q breakfast   latanoprost   1 drop Both Eyes QHS   pantoprazole   40 mg Oral Daily   timolol   1 drop Both Eyes BID   Continuous Infusions:  cefTRIAXone  (ROCEPHIN )  IV 2 g (02/17/24 0856)   heparin  1,350 Units/hr (02/18/24 0704)     LOS: 1 day    Time spent: 35 minutes    Elli Groesbeck A Katlin Ciszewski, MD [facility]   If 7PM-7AM, please contact night-coverage [email or web address]  02/18/2024, 7:32 AM

## 2024-02-19 DIAGNOSIS — I824Z1 Acute embolism and thrombosis of unspecified deep veins of right distal lower extremity: Secondary | ICD-10-CM

## 2024-02-19 DIAGNOSIS — I82409 Acute embolism and thrombosis of unspecified deep veins of unspecified lower extremity: Secondary | ICD-10-CM | POA: Insufficient documentation

## 2024-02-19 DIAGNOSIS — I2699 Other pulmonary embolism without acute cor pulmonale: Secondary | ICD-10-CM | POA: Diagnosis not present

## 2024-02-19 LAB — HEPARIN LEVEL (UNFRACTIONATED): Heparin Unfractionated: 0.73 [IU]/mL — ABNORMAL HIGH (ref 0.30–0.70)

## 2024-02-19 LAB — CBC
HCT: 30.8 % — ABNORMAL LOW (ref 36.0–46.0)
Hemoglobin: 9.2 g/dL — ABNORMAL LOW (ref 12.0–15.0)
MCH: 25.4 pg — ABNORMAL LOW (ref 26.0–34.0)
MCHC: 29.9 g/dL — ABNORMAL LOW (ref 30.0–36.0)
MCV: 85.1 fL (ref 80.0–100.0)
Platelets: 242 K/uL (ref 150–400)
RBC: 3.62 MIL/uL — ABNORMAL LOW (ref 3.87–5.11)
RDW: 14.8 % (ref 11.5–15.5)
WBC: 5.2 K/uL (ref 4.0–10.5)
nRBC: 0 % (ref 0.0–0.2)

## 2024-02-19 MED ORDER — APIXABAN 5 MG PO TABS
10.0000 mg | ORAL_TABLET | Freq: Two times a day (BID) | ORAL | Status: DC
  Administered 2024-02-19 – 2024-02-20 (×3): 10 mg via ORAL
  Filled 2024-02-19 (×3): qty 2

## 2024-02-19 MED ORDER — APIXABAN 5 MG PO TABS: 5.0000 mg | ORAL_TABLET | Freq: Two times a day (BID) | ORAL | Status: DC

## 2024-02-19 NOTE — Progress Notes (Signed)
 PHARMACY - ANTICOAGULATION CONSULT NOTE  Pharmacy Consult for IV heparin  Indication: pulmonary embolus and DVT  No Known Allergies  Patient Measurements: Height: 5' 2 (157.5 cm) Weight: 86.2 kg (190 lb) IBW/kg (Calculated) : 50.1 Heparin  weight = 69 kg  Vital Signs: Temp: 98.2 F (36.8 C) (09/10 0519) BP: 136/73 (09/10 0519) Pulse Rate: 73 (09/10 0519)  Labs: Recent Labs    02/16/24 0928 02/16/24 2253 02/17/24 0440 02/17/24 0724 02/17/24 1415 02/18/24 0458 02/19/24 0512  HGB 11.5*  --  9.5*  --   --  9.0* 9.2*  HCT 38.4  --  31.6*  --   --  29.9* 30.8*  PLT 293  --  227  --   --  209 242  HEPARINUNFRC  --    < >  --    < > 0.43 0.55 0.73*  CREATININE 0.95  --  0.90  --   --  0.79  --    < > = values in this interval not displayed.    Estimated Creatinine Clearance: 67.6 mL/min (by C-G formula based on SCr of 0.79 mg/dL).   Medical History: Past Medical History:  Diagnosis Date   Arthritis    Hypertension    Pre-diabetes     Assessment: Patient presented with abdominal pain. Per CT, found to have bilateral PE with right heart strain. Doppler also positive for acute R DVT. Patient not on any anticoagulation prior to admission. Pharmacy to dose IV heparin .  Today, 02/19/24: HL 0.73--supratherapeutic on heparin  1350 units/hr Hgb 11.5>>9.5>>9.0>> 9.2 from admission--low, stable Plts 293>>227>>209>> 242 f No s/sx of bleeding reported  Goal of Therapy:  Heparin  level 0.3-0.7 units/ml Monitor platelets by anticoagulation protocol: Yes   Plan:  Decrease IV heparin  rate to 1250 units/hr Check heparin  level 6 hr after rate decrease Daily CBC, heparin  level Continue to monitor for s/sx of bleeding  F/u long-term anticoag plan DOAC price checks: Eliquis  and Xarelto both $5 with patient's insurance   Arvin Gauss, PharmD Clinical Pharmacist  9/10/20256:12 AM

## 2024-02-19 NOTE — Progress Notes (Signed)
  Progress Note   Patient: Laurie Cooke FMW:[id number] DOB: February 24, 1956 DOA: 02/16/2024     2 DOS: the patient was seen and examined on 02/19/2024   Brief hospital course:   Assessment and Plan: Acute PE Acute right lower extremity DVT No hypoxia.  2D echocardiogram with normal RV systolic function.  Initially treated with heparin , transition to oral anticoagulation today.   Fever Probably related to PE, resolved nearly 72 hours   Hypertension - Holding valsartan and hydrochlorothiazide for now.  Stable.   GERD Protonix .     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   Weight as of this encounter: 86.2 kg.  Overall stable, anticipate discharge tomorrow    Subjective:  Feels good Breathing fine  Physical Exam: Vitals:   02/18/24 0646 02/18/24 1333 02/18/24 2006 02/19/24 0519  BP: 138/75 133/68 112/63 136/73  Pulse: 79 70 75 73  Resp: 18 16 17 18   Temp: 99.1 F (37.3 C) 98.4 F (36.9 C) 98.2 F (36.8 C) 98.2 F (36.8 C)  TempSrc: Oral     SpO2: 98% 99% 99% 96%  Weight:      Height:       Physical Exam Vitals reviewed.  Constitutional:      General: She is not in acute distress.    Appearance: She is not ill-appearing or toxic-appearing.  Cardiovascular:     Rate and Rhythm: Normal rate and regular rhythm.     Heart sounds: No murmur heard. Pulmonary:     Effort: Pulmonary effort is normal. No respiratory distress.     Breath sounds: No wheezing, rhonchi or rales.  Neurological:     Mental Status: She is alert.  Psychiatric:        Mood and Affect: Mood normal.        Behavior: Behavior normal.     Data Reviewed: Hemoglobin stable at 9.2 Echo noted Ultrasound bilateral lower extremities  noted  Family Communication: Family at bedside  Disposition: Status is: Inpatient Remains inpatient appropriate because: Acute PE     Time spent: 35 minutes  Author: Toribio Door, MD 02/19/2024 11:17 AM  For on call review [email or web address].

## 2024-02-19 NOTE — Hospital Course (Signed)
 Pulmonary embolism with right heart strain.  The patient had knee surgery last month. Plan to transition to Eliquis  9/10

## 2024-02-19 NOTE — Discharge Instructions (Addendum)
 Information on my medicine - ELIQUIS  (apixaban )  Why was Eliquis  prescribed for you? Eliquis  was prescribed to treat blood clots that may have been found in the veins of your legs (deep vein thrombosis) or in your lungs (pulmonary embolism) and to reduce the risk of them occurring again.  What do You need to know about Eliquis  ? The starting dose is 10 mg (two 5 mg tablets) taken TWICE daily for the FIRST SEVEN (7) DAYS, then on 9/17  the dose is reduced to ONE 5 mg tablet taken TWICE daily.  Eliquis  may be taken with or without food.   Try to take the dose about the same time in the morning and in the evening. If you have difficulty swallowing the tablet whole please discuss with your pharmacist how to take the medication safely.  Take Eliquis  exactly as prescribed and DO NOT stop taking Eliquis  without talking to the doctor who prescribed the medication.  Stopping may increase your risk of developing a new blood clot.  Refill your prescription before you run out.  After discharge, you should have regular check-up appointments with your healthcare provider that is prescribing your Eliquis .    What do you do if you miss a dose? If a dose of ELIQUIS  is not taken at the scheduled time, take it as soon as possible on the same day and twice-daily administration should be resumed. The dose should not be doubled to make up for a missed dose.  Important Safety Information A possible side effect of Eliquis  is bleeding. You should call your healthcare provider right away if you experience any of the following: Bleeding from an injury or your nose that does not stop. Unusual colored urine (red or dark brown) or unusual colored stools (red or black). Unusual bruising for unknown reasons. A serious fall or if you hit your head (even if there is no bleeding).  Some medicines may interact with Eliquis  and might increase your risk of bleeding or clotting while on Eliquis . To help avoid this,  consult your healthcare provider or pharmacist prior to using any new prescription or non-prescription medications, including herbals, vitamins, non-steroidal anti-inflammatory drugs (NSAIDs) and supplements.  This website has more information on Eliquis  (apixaban ): [email or web address] .[email or web address] dena

## 2024-02-19 NOTE — Progress Notes (Signed)
 PHARMACY - ANTICOAGULATION CONSULT NOTE  Pharmacy Consult for IV heparin  >> Eliquis  Indication: pulmonary embolus and DVT  No Known Allergies  Patient Measurements: Height: 5' 2 (157.5 cm) Weight: 86.2 kg (190 lb) IBW/kg (Calculated) : 50.1 Heparin  weight = 69 kg  Vital Signs: Temp: 98.2 F (36.8 C) (09/10 0519) BP: 136/73 (09/10 0519) Pulse Rate: 73 (09/10 0519)  Labs: Recent Labs    02/17/24 0440 02/17/24 0724 02/17/24 1415 02/18/24 0458 02/19/24 0512  HGB 9.5*  --   --  9.0* 9.2*  HCT 31.6*  --   --  29.9* 30.8*  PLT 227  --   --  209 242  HEPARINUNFRC  --    < > 0.43 0.55 0.73*  CREATININE 0.90  --   --  0.79  --    < > = values in this interval not displayed.    Estimated Creatinine Clearance: 67.6 mL/min (by C-G formula based on SCr of 0.79 mg/dL).   Medical History: Past Medical History:  Diagnosis Date   Arthritis    Hypertension    Pre-diabetes     Assessment: Patient presented with abdominal pain. Per CT, found to have bilateral PE with right heart strain. Doppler also positive for acute R DVT. Patient not on any anticoagulation prior to admission. Pharmacy originally to dose IV heparin , but now transitioning to Eliquis . Both Eliquis  and Xarelto have monthly $5 copay with patient's insurance.   Today, 02/19/24: HL 0.73--supratherapeutic on heparin  1350 units/hr, so rate was decreased to 1250 units/hr this AM. Hgb 11.5>>9.5>>9.0>>9.2 from admission--low, stable Plts 293>>227>>209>>242 No s/sx of bleeding reported  Goal of Therapy:  Heparin  level 0.3-0.7 units/ml Monitor platelets by anticoagulation protocol: Yes   Plan:  Stop heparin  now Start Eliquis  10mg  PO BID x7 days, followed by 5mg  PO BID thereafter Continue to monitor for s/sx of bleeding    Lacinda Moats, PharmD Clinical Pharmacist  9/10/202512:07 PM

## 2024-02-20 ENCOUNTER — Other Ambulatory Visit (HOSPITAL_COMMUNITY): Payer: Self-pay

## 2024-02-20 DIAGNOSIS — I82441 Acute embolism and thrombosis of right tibial vein: Secondary | ICD-10-CM | POA: Diagnosis not present

## 2024-02-20 DIAGNOSIS — D509 Iron deficiency anemia, unspecified: Secondary | ICD-10-CM | POA: Diagnosis not present

## 2024-02-20 DIAGNOSIS — I2699 Other pulmonary embolism without acute cor pulmonale: Secondary | ICD-10-CM | POA: Diagnosis not present

## 2024-02-20 LAB — CBC
HCT: 31 % — ABNORMAL LOW (ref 36.0–46.0)
Hemoglobin: 9.3 g/dL — ABNORMAL LOW (ref 12.0–15.0)
MCH: 25.5 pg — ABNORMAL LOW (ref 26.0–34.0)
MCHC: 30 g/dL (ref 30.0–36.0)
MCV: 84.9 fL (ref 80.0–100.0)
Platelets: 255 K/uL (ref 150–400)
RBC: 3.65 MIL/uL — ABNORMAL LOW (ref 3.87–5.11)
RDW: 14.7 % (ref 11.5–15.5)
WBC: 4.8 K/uL (ref 4.0–10.5)
nRBC: 0 % (ref 0.0–0.2)

## 2024-02-20 MED ORDER — APIXABAN 5 MG PO TABS
5.0000 mg | ORAL_TABLET | Freq: Two times a day (BID) | ORAL | 0 refills | Status: AC
  Filled 2024-02-20: qty 180, 90d supply, fill #0

## 2024-02-20 MED ORDER — APIXABAN (ELIQUIS) VTE STARTER PACK (10MG AND 5MG)
ORAL_TABLET | ORAL | 0 refills | Status: AC
  Filled 2024-02-20: qty 74, 30d supply, fill #0

## 2024-02-20 NOTE — Progress Notes (Signed)
Discharge medication delivered to patient at bedside.

## 2024-02-20 NOTE — TOC Transition Note (Signed)
 Transition of Care [facility]) - Discharge Note   Patient Details  Name: Laurie Cooke MRN: [id number] Date of Birth: 1956-03-17  [facility] CM/SW Contact:  Bascom Service, RN Phone Number: 02/20/2024, 10:01 AM   Clinical Narrative: d/c home no CM needs.      Final next level of care: Home/Self Care Barriers to Discharge: No Barriers Identified   Patient Goals and CMS Choice Patient states their goals for this hospitalization and ongoing recovery are:: Home CMS Medicare.gov Compare Post Acute Care list provided to:: Patient Choice offered to / list presented to : Patient [place] ownership interest in [facility].provided to:: Patient    Discharge Placement                       Discharge Plan and Services Additional resources added to the After Visit Summary for       Post Acute Care Choice: NA                               Social Drivers of Health (SDOH) Interventions SDOH Screenings   Food Insecurity: No Food Insecurity (02/16/2024)  Housing: Low Risk  (02/16/2024)  Transportation Needs: No Transportation Needs (02/16/2024)  Utilities: Not At Risk (02/16/2024)  Social Connections: Unknown (02/16/2024)  Tobacco Use: Low Risk  (02/16/2024)     Readmission Risk Interventions     No data to display

## 2024-02-20 NOTE — Discharge Summary (Addendum)
 Physician Discharge Summary   Patient: Laurie Cooke MRN: [id number] DOB: May 13, 1956  Admit date:     02/16/2024  Discharge date: 02/20/24  Discharge Physician: Toribio Door   PCP: Sun, Vyvyan, MD   Recommendations at discharge:   Acute PE Acute right lower extremity DVT posterior tibial veins In context of total knee arthroplasty approximately 1 month ago Consider anticoagulation 6+ months, defer to PCP  Iron deficiency anemia Continue iron supplement Likely at baseline, suggest outpatient follow-up and anemia panel  Discharge Diagnoses: Principal Problem:   Pulmonary embolism (HCC) Active Problems:   Benign essential hypertension   Gastroesophageal reflux disease   Iron deficiency anemia   Prediabetes   Primary open-angle glaucoma, left eye, mild stage   Hypophosphatemia   DVT (deep venous thrombosis) (HCC) Obesity class I   Resolved Problems:   * No resolved hospital problems. *  Hospital Course: 68 year old woman PMH including total knee arthroplasty approximately 1 month prior, presented with abdominal complaints, imaging revealed PE.  Patient admitted for further evaluation.  Echocardiogram was reassuring, Doppler revealed right lower extremity DVT.  Hospitalization complicated, transition to apixaban  and discharged home.  Consultants None   Procedures/Events None   Acute PE Acute right lower extremity DVT posterior tibial veins No hypoxia.  CT suggested heart strain.  2D echocardiogram with normal RV systolic function.  Initially treated with heparin , transition to apixaban . Clinically favor atelectasis over pulmonary infarcts   Fever Probably related to PE, resolved more than 72 hours.   Hypertension Stable   GERD Protonix .     Primary open angle glaucoma left eye mild stage Continue eyedrops   Hypophosphatemia Resolved   Iron deficiency anemia Continue iron supplement Likely at baseline, suggest outpatient follow-up and anemia panel    Right TKA 1 month ago  Obesity class I   Estimated body mass index is 34.75 kg/m as calculated from the following:   Height as of this encounter: 5' 2 (1.575 m).   Weight as of this encounter: 86.2 kg.    Disposition: Home Diet recommendation:  Regular diet DISCHARGE MEDICATION: Allergies as of 02/20/2024   No Known Allergies      Medication List     STOP taking these medications    aspirin  EC 81 MG tablet   methocarbamol  500 MG tablet Commonly known as: ROBAXIN        TAKE these medications    Apixaban  Starter Pack (10mg  and 5mg ) Commonly known as: ELIQUIS  STARTER PACK Take as directed on package: start with two-5mg  tablets twice daily for 7 days. On day 8, switch to one-5mg  tablet twice daily.   Eliquis  5 MG Tabs tablet Generic drug: apixaban  Take 1 tablet (5 mg total) by mouth 2 (two) times daily. Do not start until you have finished starter pack Start taking on: March 20, 2024   ferrous sulfate  325 (65 FE) MG tablet Take 325 mg by mouth daily with breakfast.   latanoprost  0.005 % ophthalmic solution Commonly known as: XALATAN  Place 1 drop into both eyes at bedtime.   oxyCODONE  5 MG immediate release tablet Commonly known as: Oxy IR/ROXICODONE  Take 5-10 mg by mouth every 6 (six) hours as needed for severe pain (pain score 7-10).   timolol  0.5 % ophthalmic solution Commonly known as: TIMOPTIC  Place 1 drop into both eyes 2 (two) times daily.   valsartan-hydrochlorothiazide 160-25 MG tablet Commonly known as: DIOVAN-HCT Take 1 tablet by mouth daily.        Follow-up Information     Sun,  Vyvyan, MD. Schedule an appointment as soon as possible for a visit in 1 week(s).   Specialty: Family Medicine Contact information: [address] [place] [address] [phone]               No events charted overnight Feels fine  Discharge Exam: Filed Weights   02/16/24 1600  Weight: 86.2 kg   Physical Exam Vitals  (Afebrile, vital signs stable) reviewed.  Constitutional:      General: She is not in acute distress.    Appearance: She is not ill-appearing or toxic-appearing.  Cardiovascular:     Rate and Rhythm: Normal rate and regular rhythm.     Heart sounds: No murmur heard. Pulmonary:     Effort: Pulmonary effort is normal. No respiratory distress.     Breath sounds: No wheezing, rhonchi or rales.  Neurological:     Mental Status: She is alert.  Psychiatric:        Mood and Affect: Mood normal.        Behavior: Behavior normal.    Hemoglobin stable at 9.3  Condition at discharge: good  The results of significant diagnostics from this hospitalization (including imaging, microbiology, ancillary and laboratory) are listed below for reference.   Imaging Studies: VAS US  LOWER EXTREMITY VENOUS (DVT) Result Date: 02/17/2024  Lower Venous DVT Study Patient Name:  Laurie Cooke  Date of Exam:   02/17/2024 Medical Rec #: [id number]     Accession #:    [id number] Date of Birth: [birth year]     Patient Gender: F Patient Age:   35 years Exam Location:  [facility] Procedure:      VAS US  LOWER EXTREMITY VENOUS (DVT) Referring Phys: DAVID ORTIZ --------------------------------------------------------------------------------  Indications: Pulmonary embolism, Post-op, and right total knee arthroplasty.  Comparison Study: Previous study of the left lower extremity on 8.298.2023. Performing Technologist: Edilia Elden Appl  Examination Guidelines: A complete evaluation includes B-mode imaging, spectral Doppler, color Doppler, and power Doppler as needed of all accessible portions of each vessel. Bilateral testing is considered an integral part of a complete examination. Limited examinations for reoccurring indications may be performed as noted. The reflux portion of the exam is performed with the patient in reverse Trendelenburg.  +---------+---------------+---------+-----------+----------+--------------+ RIGHT     CompressibilityPhasicitySpontaneityPropertiesThrombus Aging +---------+---------------+---------+-----------+----------+--------------+ CFV      Full           Yes      Yes                                 +---------+---------------+---------+-----------+----------+--------------+ SFJ      Full           Yes      Yes                                 +---------+---------------+---------+-----------+----------+--------------+ FV Prox  Full                                                        +---------+---------------+---------+-----------+----------+--------------+ FV Mid   Full                                                        +---------+---------------+---------+-----------+----------+--------------+  FV DistalFull                                                        +---------+---------------+---------+-----------+----------+--------------+ PFV      Full                                                        +---------+---------------+---------+-----------+----------+--------------+ POP      Full           Yes      Yes                                 +---------+---------------+---------+-----------+----------+--------------+ PTV      Partial        No       No                                  +---------+---------------+---------+-----------+----------+--------------+ PERO     Full                                                        +---------+---------------+---------+-----------+----------+--------------+ Focal deep vein thrombosis noted in one of the paired right posterior tibial veins at the middle level. Fluid collection noted in the right popliteal fossa measuring 5.3 x 0.9 x 2.2 cm.  +---------+---------------+---------+-----------+----------+--------------+ LEFT     CompressibilityPhasicitySpontaneityPropertiesThrombus Aging +---------+---------------+---------+-----------+----------+--------------+ CFV      Full            Yes      Yes                                 +---------+---------------+---------+-----------+----------+--------------+ SFJ      Full           Yes      Yes                                 +---------+---------------+---------+-----------+----------+--------------+ FV Prox  Full                                                        +---------+---------------+---------+-----------+----------+--------------+ FV Mid   Full                                                        +---------+---------------+---------+-----------+----------+--------------+ FV DistalFull                                                        +---------+---------------+---------+-----------+----------+--------------+  PFV      Full                                                        +---------+---------------+---------+-----------+----------+--------------+ POP      Full           Yes      Yes                                 +---------+---------------+---------+-----------+----------+--------------+ PTV      Full                                                        +---------+---------------+---------+-----------+----------+--------------+ PERO     Full                                                        +---------+---------------+---------+-----------+----------+--------------+     Summary: RIGHT: - Findings consistent with acute deep vein thrombosis involving the right posterior tibial veins.  - A cystic structure is found in the popliteal fossa.  LEFT: - There is no evidence of deep vein thrombosis in the lower extremity.  - No cystic structure found in the popliteal fossa.  *See table(s) above for measurements and observations. Electronically signed by Gaile New MD on 02/17/2024 at 1:44:19 PM.    Final    ECHOCARDIOGRAM COMPLETE Result Date: 02/17/2024    ECHOCARDIOGRAM REPORT   Patient Name:   Laurie Cooke Date of Exam: 02/17/2024 Medical Rec #:  [id number]    Height:        62.0 in Accession #:    [id number]   Weight:       190.0 lb Date of Birth:  November 29, 1955    BSA:          1.870 m Patient Age:    68 years     BP:           122/73 mmHg Patient Gender: F            HR:           85 bpm. Exam Location:  Inpatient Procedure: 2D Echo, Cardiac Doppler and Color Doppler (Both Spectral and Color            Flow Doppler were utilized during procedure). Indications:    Pulmonary Embolus I26.09  History:        Patient has no prior history of Echocardiogram examinations.                 Risk Factors:Hypertension and Pre-diabetes.  Sonographer:    Koleen Popper RDCS Referring Phys: [id number] DAVID MANUEL ORTIZ  Sonographer Comments: Image acquisition challenging due to uncooperative patient. IMPRESSIONS  1. Left ventricular ejection fraction, by estimation, is 70 to 75%. The left ventricle has hyperdynamic function. The left ventricle has no regional wall motion abnormalities. There is mild left ventricular hypertrophy. Left ventricular diastolic parameters are consistent with Grade I  diastolic dysfunction (impaired relaxation).  2. Right ventricular systolic function is normal. The right ventricular size is normal. There is normal pulmonary artery systolic pressure.  3. The mitral valve is normal in structure. No evidence of mitral valve regurgitation. No evidence of mitral stenosis.  4. The aortic valve is normal in structure. Aortic valve regurgitation is not visualized. No aortic stenosis is present.  5. The inferior vena cava is normal in size with greater than 50% respiratory variability, suggesting right atrial pressure of 3 mmHg. FINDINGS  Left Ventricle: Left ventricular ejection fraction, by estimation, is 70 to 75%. The left ventricle has hyperdynamic function. The left ventricle has no regional wall motion abnormalities. Strain was performed and the global longitudinal strain is indeterminate. The left ventricular internal cavity size was normal in size. There is mild left  ventricular hypertrophy. Left ventricular diastolic parameters are consistent with Grade I diastolic dysfunction (impaired relaxation). Right Ventricle: The right ventricular size is normal. No increase in right ventricular wall thickness. Right ventricular systolic function is normal. There is normal pulmonary artery systolic pressure. The tricuspid regurgitant velocity is 2.57 m/s, and  with an assumed right atrial pressure of 3 mmHg, the estimated right ventricular systolic pressure is 29.4 mmHg. Left Atrium: Left atrial size was normal in size. Right Atrium: Right atrial size was normal in size. Pericardium: There is no evidence of pericardial effusion. Mitral Valve: The mitral valve is normal in structure. No evidence of mitral valve regurgitation. No evidence of mitral valve stenosis. Tricuspid Valve: The tricuspid valve is normal in structure. Tricuspid valve regurgitation is trivial. No evidence of tricuspid stenosis. Aortic Valve: The aortic valve is normal in structure. Aortic valve regurgitation is not visualized. No aortic stenosis is present. Pulmonic Valve: The pulmonic valve was normal in structure. Pulmonic valve regurgitation is not visualized. No evidence of pulmonic stenosis. Aorta: The aortic root is normal in size and structure. Venous: The inferior vena cava is normal in size with greater than 50% respiratory variability, suggesting right atrial pressure of 3 mmHg. IAS/Shunts: The interatrial septum was not well visualized. Additional Comments: 3D was performed not requiring image post processing on an independent workstation and was indeterminate.  LEFT VENTRICLE PLAX 2D LVIDd:         3.80 cm   Diastology LVIDs:         2.50 cm   LV e' medial:    6.74 cm/s LV PW:         1.00 cm   LV E/e' medial:  10.2 LV IVS:        1.30 cm   LV e' lateral:   10.00 cm/s LVOT diam:     2.10 cm   LV E/e' lateral: 6.9 LV SV:         82 LV SV Index:   44 LVOT Area:     3.46 cm  RIGHT VENTRICLE         IVC TAPSE  (M-mode): 2.3 cm  IVC diam: 1.50 cm LEFT ATRIUM           Index LA diam:      3.60 cm 1.92 cm/m LA Vol (A4C): 34.3 ml 18.34 ml/m  AORTIC VALVE LVOT Vmax:   132.00 cm/s LVOT Vmean:  85.100 cm/s LVOT VTI:    0.237 m  AORTA Ao Root diam: 2.70 cm MITRAL VALVE                TRICUSPID VALVE MV Area (PHT): 3.74 cm  TR Peak grad:   26.4 mmHg MV Decel Time: 203 msec     TR Vmax:        257.00 cm/s MV E velocity: 68.60 cm/s MV A velocity: 106.00 cm/s  SHUNTS MV E/A ratio:  0.65         Systemic VTI:  0.24 m                             Systemic Diam: 2.10 cm Maude Emmer MD Electronically signed by Maude Emmer MD Signature Date/Time: 02/17/2024/9:00:07 AM    Final    CT Angio Chest PE W and/or Wo Contrast Result Date: 02/16/2024 CLINICAL DATA:  Pulmonary embolism suspected, high probability. EXAM: CT ANGIOGRAPHY CHEST WITH CONTRAST TECHNIQUE: Multidetector CT imaging of the chest was performed using the standard protocol during bolus administration of intravenous contrast. Multiplanar CT image reconstructions and MIPs were obtained to evaluate the vascular anatomy. RADIATION DOSE REDUCTION: This exam was performed according to the departmental dose-optimization program which includes automated exposure control, adjustment of the mA and/or kV according to patient size and/or use of iterative reconstruction technique. CONTRAST:  80mL OMNIPAQUE  IOHEXOL  350 MG/ML SOLN COMPARISON:  None Available. FINDINGS: Cardiovascular: Filling defects are seen in the pulmonary arteries bilaterally, as proximal as the lobar level. RV/LV ratio 1.0. Atherosclerotic calcification of the aorta, aortic valve and coronary arteries. Heart is enlarged. No pericardial effusion. Mediastinum/Nodes: No pathologically enlarged mediastinal, hilar or axillary lymph nodes. Air in the esophagus can be seen with dysmotility. Lungs/Pleura: Patchy ground-glass and volume loss in both lower lobes. No pleural fluid. Airway is unremarkable. Upper Abdomen:  Small left hepatic lobe cyst. No specific follow-up necessary. Visualized portions of the liver, adrenal glands, kidneys, spleen, pancreas, stomach and bowel are otherwise grossly unremarkable. No upper abdominal adenopathy. Musculoskeletal: Degenerative changes in the spine. Review of the MIP images confirms the above findings. IMPRESSION: 1. Bilateral pulmonary emboli with the most proximal clot seen at the lobar level. Positive for acute PE with CT evidence of right heart strain (RV/LV Ratio = 1.0) consistent with at least submassive (intermediate risk) PE. The presence of right heart strain has been associated with an increased risk of morbidity and mortality. Please refer to the Code PE Focused order set in EPIC. Critical Value/emergent results were called by telephone at the time of interpretation on 02/16/2024 at 3:55 pm to provider DR. GOLDSTEIN, who verbally acknowledged these results. 2. Bilateral lower lobe infarcts and/or atelectasis. 3. Aortic atherosclerosis (ICD10-I70.0). Coronary artery calcification. Electronically Signed   By: Newell Eke M.D.   On: 02/16/2024 15:55   CT ABDOMEN PELVIS W CONTRAST Result Date: 02/16/2024 CLINICAL DATA:  Periumbilical pain EXAM: CT ABDOMEN AND PELVIS WITH CONTRAST TECHNIQUE: Multidetector CT imaging of the abdomen and pelvis was performed using the standard protocol following bolus administration of intravenous contrast. RADIATION DOSE REDUCTION: This exam was performed according to the departmental dose-optimization program which includes automated exposure control, adjustment of the mA and/or kV according to patient size and/or use of iterative reconstruction technique. CONTRAST:  OMNIPAQUE  IOHEXOL  300 MG/ML  SOLN COMPARISON:  Abdomen and pelvis dated 01/20/2006 FINDINGS: Lower chest: Bilateral lower lobe subsegmental atelectasis interspersed with heterogeneous ground-glass opacities. No pleural effusion or pneumothorax demonstrated. Partially imaged  heart size is normal. Apparent filling defect within partially imaged left lower lobe pulmonary arteries (2:1) and right interlobar artery (2:2). Hepatobiliary: Subcentimeter hypodensity in segment 2 (2:24), not substantially changed from 2007, likely  benign cyst. No intra or extrahepatic biliary ductal dilation. Normal gallbladder. Pancreas: No focal lesions or main ductal dilation. Spleen: Normal in size without focal abnormality. Adrenals/Urinary Tract: No adrenal nodules. No suspicious renal mass, calculi or hydronephrosis. Underdistended urinary bladder. Stomach/Bowel: Small hiatal hernia. Normal appearance of the stomach. No evidence of bowel wall thickening, distention, or inflammatory changes. Appendix is not discretely seen. Vascular/Lymphatic: Aortic atherosclerosis. Left external iliac lymph nodes measuring up to 10 mm (2:72). Reproductive: No adnexal masses. Other: No free fluid, fluid collection, or free air. Musculoskeletal: Status post left hip arthroplasty. Hardware appears intact. Multilevel degenerative changes of the partially imaged thoracic and lumbar spine. Grade 1 anterolisthesis at L4-5. Mild compression deformity of L3 and L4 superior endplate, age indeterminate. IMPRESSION: 1. Apparent filling defect within partially imaged left lower lobe pulmonary arteries and right interlobar artery, suspicious for pulmonary emboli. 2. Bilateral lower lobe subsegmental atelectasis interspersed with heterogeneous ground-glass opacities, which may represent atelectasis or pulmonary infarction in the setting of suspected pulmonary emboli. 3. Left external iliac lymph nodes measuring up to 10 mm, nonspecific, may be reactive to left hip arthroplasty. 4.  Aortic Atherosclerosis (ICD10-I70.0). Critical Value/emergent results were called by telephone at the time of interpretation on 02/16/2024 at 11:54 am to provider [facility] , who verbally acknowledged these results. Electronically Signed   By: Limin  Xu  M.D.   On: 02/16/2024 11:57   DG Chest 2 View Result Date: 02/16/2024 CLINICAL DATA:  Abdominal pain. EXAM: CHEST - 2 VIEW COMPARISON:  None Available. FINDINGS: The heart size and mediastinal contours are within normal limits. Low lung volumes are noted. Mild atelectatic changes are seen within the bilateral lung bases. No pleural effusion or pneumothorax is identified. The visualized skeletal structures are unremarkable. IMPRESSION: Low lung volumes with mild bibasilar atelectasis. Electronically Signed   By: Suzen Dials M.D.   On: 02/16/2024 10:26    Microbiology: Results for orders placed or performed during the hospital encounter of 02/16/24  Urine Culture (for pregnant, neutropenic or urologic patients or patients with an indwelling urinary catheter)     Status: Abnormal   Collection Time: 02/16/24 11:07 AM   Specimen: Urine, Clean Catch  Result Value Ref Range Status   Specimen Description   Final    URINE, CLEAN CATCH Performed at [facility], [address] [place], [place] [address]    Special Requests   Final    NONE Performed at [facility], [address] [place], [place] [address]    Culture MULTIPLE SPECIES PRESENT, SUGGEST RECOLLECTION (A)  Final   Report Status 02/18/2024 FINAL  Final    Labs: CBC: Recent Labs  Lab 02/16/24 0928 02/17/24 0440 02/18/24 0458 02/19/24 0512 02/20/24 0453  WBC 9.1 9.2 6.1 5.2 4.8  HGB 11.5* 9.5* 9.0* 9.2* 9.3*  HCT 38.4 31.6* 29.9* 30.8* 31.0*  MCV 84.0 84.9 85.2 85.1 84.9  PLT 293 227 209 242 255   Basic Metabolic Panel: Recent Labs  Lab 02/16/24 0928 02/16/24 1629 02/17/24 0440 02/18/24 0458  NA 138  --  140 138  K 3.6  --  4.1 3.8  CL 99  --  103 103  CO2 27  --  25 24  GLUCOSE 153*  --  136* 102*  BUN 15  --  11 10  CREATININE 0.95  --  0.90 0.79  CALCIUM 9.8  --  8.9 9.1  MG  --  1.7  --   --  PHOS  --  2.2*  --  3.0   Liver Function Tests: Recent Labs  Lab  02/16/24 0928 02/17/24 0440  AST 18 15  ALT 25 20  ALKPHOS 114 92  BILITOT 1.0 0.9  PROT 7.7 6.5  ALBUMIN 3.9 3.3*   CBG: No results for input(s): GLUCAP in the last 168 hours.  Discharge time spent: less than 30 minutes.  Signed: Toribio Door, MD [facility] 02/20/2024

## 2024-02-21 ENCOUNTER — Other Ambulatory Visit (HOSPITAL_COMMUNITY): Payer: Self-pay

## 2024-03-05 ENCOUNTER — Ambulatory Visit
Admission: RE | Admit: 2024-03-05 | Discharge: 2024-03-05 | Disposition: A | Discharge status: Home / Self Care | Payer: PRIVATE HEALTH INSURANCE | Source: Ambulatory Visit | Attending: Family Medicine | Admitting: Family Medicine

## 2024-03-05 DIAGNOSIS — R1310 Dysphagia, unspecified: Secondary | ICD-10-CM
